# Patient Record
Sex: Male | Born: 1960 | Race: Black or African American | Hispanic: No | Smoking: Former smoker
Health system: Southern US, Community
[De-identification: ages and names within clinical notes are randomized; demographics above are authoritative.]

## PROBLEM LIST (undated history)

## (undated) DIAGNOSIS — E785 Hyperlipidemia, unspecified: Secondary | ICD-10-CM

## (undated) DIAGNOSIS — E119 Type 2 diabetes mellitus without complications: Secondary | ICD-10-CM

## (undated) DIAGNOSIS — D649 Anemia, unspecified: Secondary | ICD-10-CM

## (undated) DIAGNOSIS — G629 Polyneuropathy, unspecified: Secondary | ICD-10-CM

## (undated) DIAGNOSIS — I10 Essential (primary) hypertension: Secondary | ICD-10-CM

## (undated) DIAGNOSIS — I517 Cardiomegaly: Secondary | ICD-10-CM

## (undated) DIAGNOSIS — I2510 Atherosclerotic heart disease of native coronary artery without angina pectoris: Secondary | ICD-10-CM

## (undated) DIAGNOSIS — K219 Gastro-esophageal reflux disease without esophagitis: Secondary | ICD-10-CM

## (undated) HISTORY — DX: Anemia, unspecified: D64.9

## (undated) HISTORY — DX: Atherosclerotic heart disease of native coronary artery without angina pectoris: I25.10

## (undated) HISTORY — DX: Essential (primary) hypertension: I10

## (undated) HISTORY — DX: Gastro-esophageal reflux disease without esophagitis: K21.9

## (undated) HISTORY — DX: Hyperlipidemia, unspecified: E78.5

---

## 2006-05-19 ENCOUNTER — Emergency Department: Payer: Self-pay

## 2006-06-30 ENCOUNTER — Emergency Department: Payer: Self-pay | Admitting: Emergency Medicine

## 2011-01-28 ENCOUNTER — Ambulatory Visit: Payer: Self-pay | Admitting: General Practice

## 2012-05-09 ENCOUNTER — Ambulatory Visit: Payer: Self-pay | Admitting: General Practice

## 2012-06-07 ENCOUNTER — Ambulatory Visit: Payer: Self-pay | Admitting: General Practice

## 2013-06-18 LAB — HM COLONOSCOPY

## 2014-05-15 ENCOUNTER — Ambulatory Visit: Payer: Self-pay | Admitting: General Practice

## 2014-05-19 ENCOUNTER — Ambulatory Visit: Payer: Self-pay | Admitting: General Practice

## 2015-05-08 ENCOUNTER — Emergency Department
Admission: EM | Admit: 2015-05-08 | Discharge: 2015-05-08 | Disposition: A | Discharge status: Home / Self Care | Payer: No Typology Code available for payment source | Attending: Emergency Medicine | Admitting: Emergency Medicine

## 2015-05-08 ENCOUNTER — Encounter: Payer: Self-pay | Admitting: Emergency Medicine

## 2015-05-08 DIAGNOSIS — Y9389 Activity, other specified: Secondary | ICD-10-CM | POA: Insufficient documentation

## 2015-05-08 DIAGNOSIS — Y998 Other external cause status: Secondary | ICD-10-CM | POA: Insufficient documentation

## 2015-05-08 DIAGNOSIS — Z87891 Personal history of nicotine dependence: Secondary | ICD-10-CM | POA: Insufficient documentation

## 2015-05-08 DIAGNOSIS — Y92410 Unspecified street and highway as the place of occurrence of the external cause: Secondary | ICD-10-CM | POA: Diagnosis not present

## 2015-05-08 DIAGNOSIS — T148 Other injury of unspecified body region: Secondary | ICD-10-CM | POA: Diagnosis present

## 2015-05-08 DIAGNOSIS — R52 Pain, unspecified: Secondary | ICD-10-CM

## 2015-05-08 HISTORY — DX: Type 2 diabetes mellitus without complications: E11.9

## 2015-05-08 MED ORDER — NAPROXEN 500 MG PO TABS: 500.0000 mg | ORAL_TABLET | Freq: Two times a day (BID) | ORAL | Status: DC
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
## 2015-05-08 NOTE — Discharge Instructions (Signed)
FOLLOW UP WITH [facility] FOR RE-CHECK OF BLOOD PRESSURE  NAPROXEN FOR MUSCLE SORENESS.  EAT BEFORE TAKING THIS MEDICATION AND STOP IF ANY UPSET STOMACH
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
## 2015-05-08 NOTE — ED Notes (Signed)
D/c instructions reviewed w/ pt - pt denies any further questions or concerns at present.   

## 2015-05-20 ENCOUNTER — Other Ambulatory Visit: Payer: Self-pay | Admitting: Physician Assistant

## 2015-05-20 ENCOUNTER — Ambulatory Visit
Admission: RE | Admit: 2015-05-20 | Discharge: 2015-05-20 | Disposition: A | Discharge status: Home / Self Care | Payer: BLUE CROSS/BLUE SHIELD | Source: Ambulatory Visit | Attending: Physician Assistant | Admitting: Physician Assistant

## 2015-05-20 ENCOUNTER — Telehealth: Payer: Self-pay | Admitting: Gastroenterology

## 2015-05-20 DIAGNOSIS — K5900 Constipation, unspecified: Secondary | ICD-10-CM | POA: Diagnosis present

## 2015-05-20 DIAGNOSIS — R1084 Generalized abdominal pain: Secondary | ICD-10-CM | POA: Insufficient documentation

## 2015-05-20 DIAGNOSIS — R109 Unspecified abdominal pain: Secondary | ICD-10-CM

## 2015-05-20 NOTE — Telephone Encounter (Signed)
LVM for pt to call and sch appt with Dr. Servando SnareWohl for upper ab pain

## 2015-07-23 DIAGNOSIS — I214 Non-ST elevation (NSTEMI) myocardial infarction: Secondary | ICD-10-CM | POA: Insufficient documentation

## 2015-07-23 DIAGNOSIS — E118 Type 2 diabetes mellitus with unspecified complications: Secondary | ICD-10-CM | POA: Insufficient documentation

## 2015-07-23 HISTORY — PX: STENT PLACEMENT VASCULAR (ARMC HX): HXRAD1737

## 2016-03-09 DIAGNOSIS — I10 Essential (primary) hypertension: Secondary | ICD-10-CM | POA: Insufficient documentation

## 2016-04-10 IMAGING — CR DG ABDOMEN 1V
1 series · 2 of 2 positions shown · non-contrast
Comparison: None.

CLINICAL DATA: Sharp epigastric pain.

EXAM:
ABDOMEN - 1 VIEW

[Series 1: dg abd 1 view · 0.14mm/px · 2 of 2 slices shown]
[im 1/2]
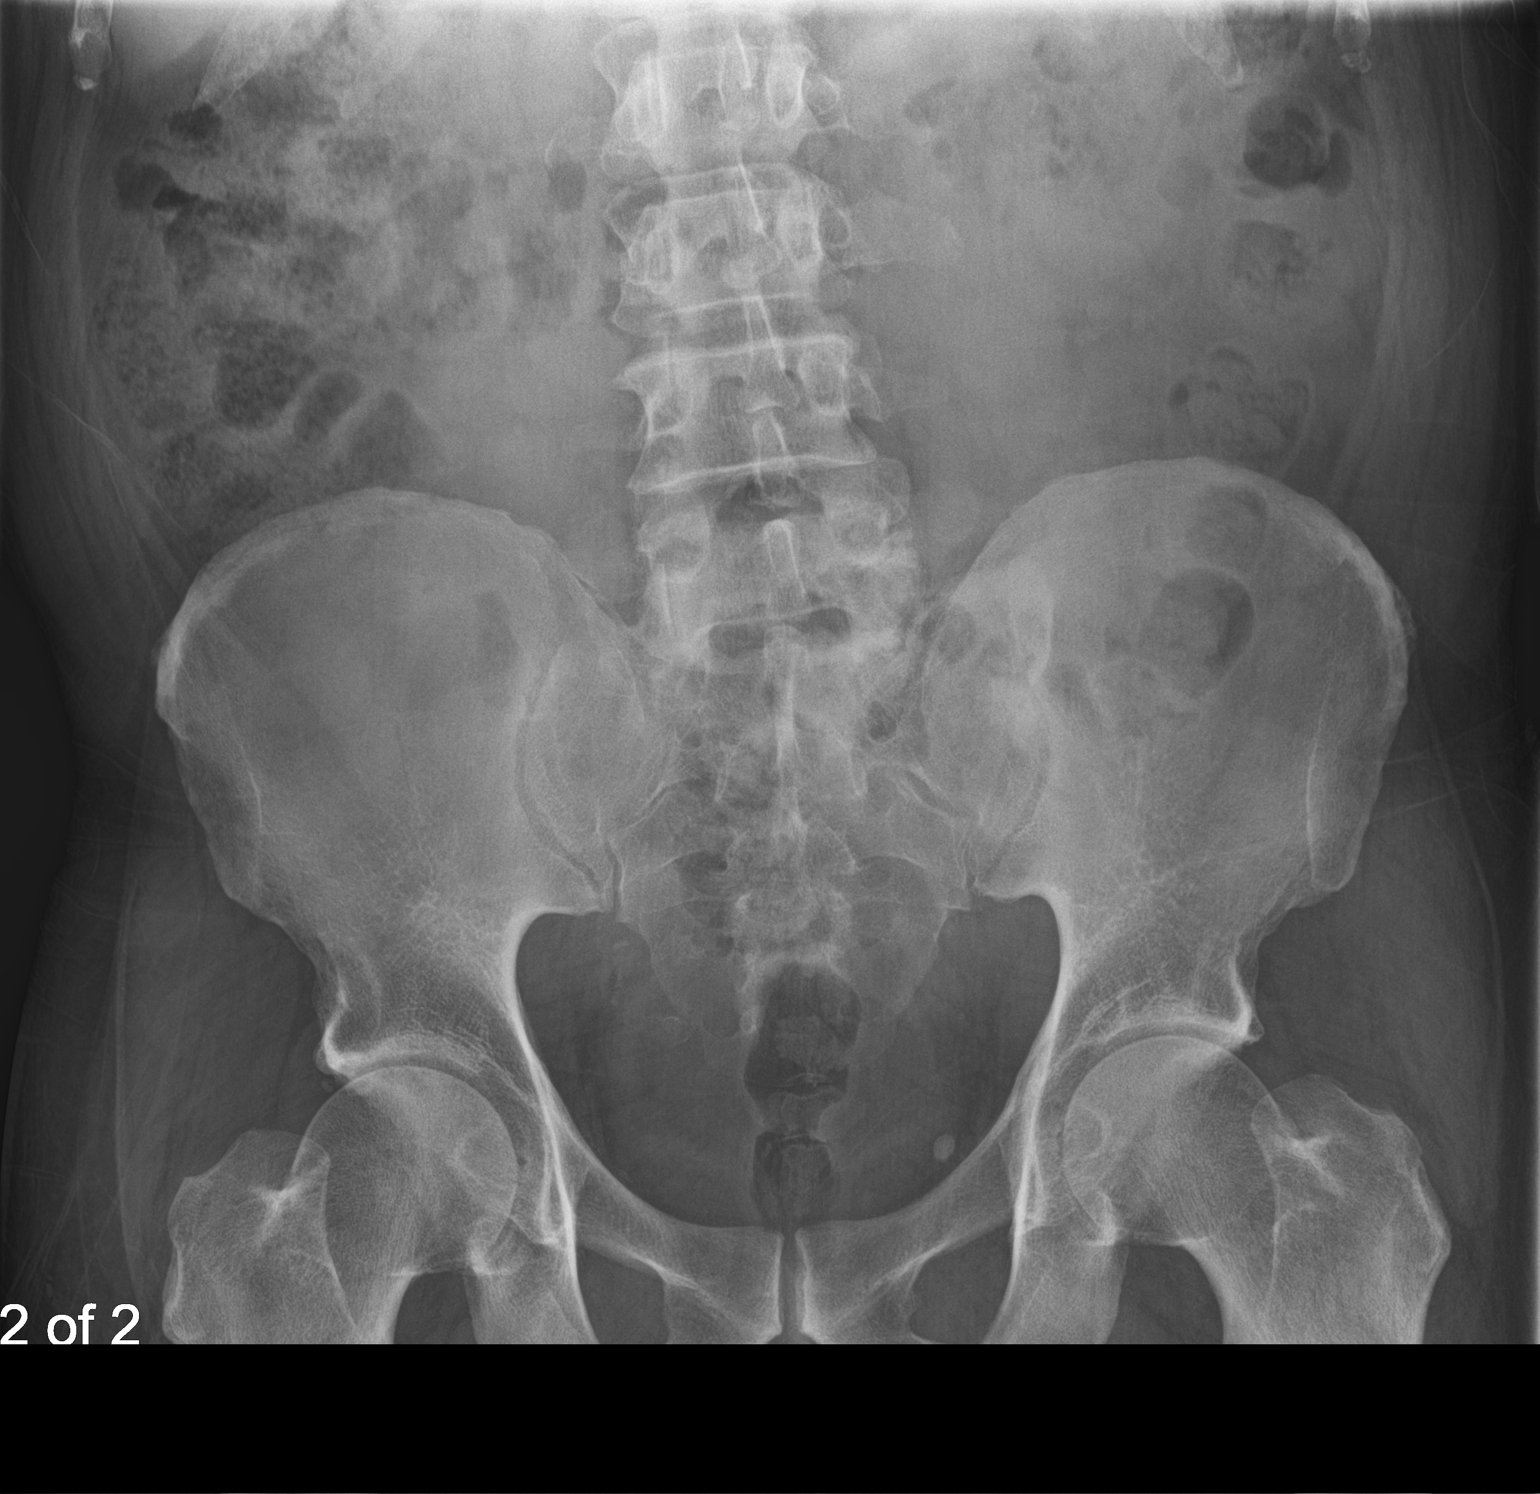
[im 2/2]
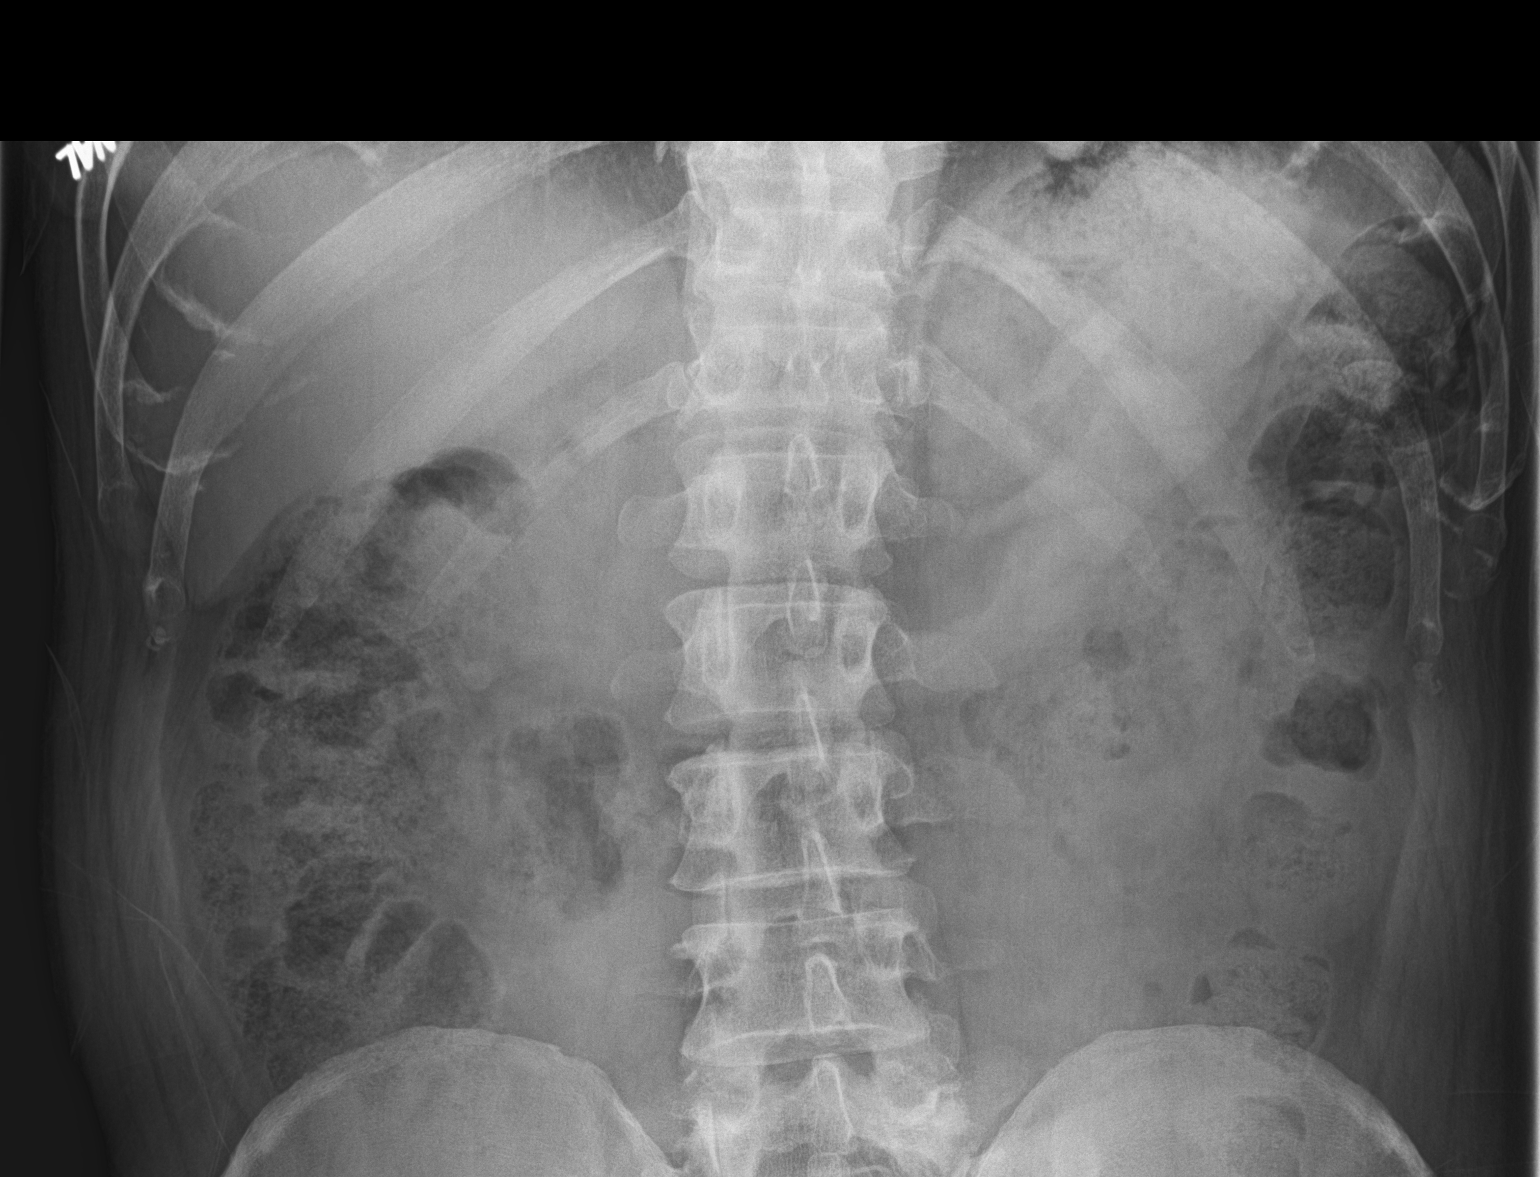

[2 of 2 positions shown; findings below may reference images not displayed]

FINDINGS: The bowel gas pattern is normal. No fecal impaction or excessive
stool in the colon. No dilated large or small bowel. The stomach is
not distended. No worrisome abdominal calcifications. Osseous
structures are normal.
IMPRESSION: Benign appearing abdomen.

## 2016-06-27 ENCOUNTER — Ambulatory Visit
Admission: RE | Admit: 2016-06-27 | Discharge: 2016-06-27 | Disposition: A | Discharge status: Home / Self Care | Payer: BLUE CROSS/BLUE SHIELD | Source: Ambulatory Visit | Attending: Family Medicine | Admitting: Family Medicine

## 2016-06-27 ENCOUNTER — Other Ambulatory Visit: Payer: Self-pay | Admitting: Family Medicine

## 2016-06-27 DIAGNOSIS — G629 Polyneuropathy, unspecified: Secondary | ICD-10-CM

## 2016-07-05 DIAGNOSIS — M79601 Pain in right arm: Secondary | ICD-10-CM | POA: Insufficient documentation

## 2016-08-15 ENCOUNTER — Ambulatory Visit: Payer: BLUE CROSS/BLUE SHIELD | Attending: Neurological Surgery | Admitting: Occupational Therapy

## 2016-08-15 DIAGNOSIS — M6281 Muscle weakness (generalized): Secondary | ICD-10-CM | POA: Diagnosis present

## 2016-08-15 DIAGNOSIS — R208 Other disturbances of skin sensation: Secondary | ICD-10-CM | POA: Diagnosis present

## 2016-08-15 NOTE — Patient Instructions (Signed)
Pt ed on modifications of task  - no flexion more than 90 -no propping up on elbow -no repetative flexion and extention of elbow  Wear elbow soft sleeve - with padding inside sleeping -and outside when propping up elbow  2 provided for work and night time   Ulnar N glide 5 reps  After heat  2 x day  and can do ice massage few times day over cubital tunnel

## 2016-08-15 NOTE — Therapy (Signed)
[facility] PHYSICAL AND SPORTS MEDICINE [address] [place], [place], [address] Phone: [phone]   Fax:  [phone]  Occupational Therapy Treatment  Patient Details  Name: Frank Miles MRN: [id number] Date of Birth: 1961/03/02 Referring Provider: Ditty  Encounter Date: 08/15/2016      OT End of Session - 08/15/16 1129    Visit Number 1   Number of Visits 8   Date for OT Re-Evaluation 09/12/16   OT Start Time 1030   OT Stop Time 1111   OT Time Calculation (min) 41 min   Activity Tolerance Patient tolerated treatment well   Behavior [facility] Therapy [facility] for tasks assessed/performed      Past Medical History:  Diagnosis Date  . Diabetes mellitus without complication     No past surgical history on file.  There were no vitals filed for this visit.      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   Referring Provider Ditty   Onset Date 02/06/16     Home  Environment   Lives With Alone     Prior Function   Vocation Full time employment   Leisure [place] full time for [place]  - mowing a lot since Spring , work out and walk , do own house work , R hand dominant -     Designer, fashion/clothingensation   Semmes Weinstein Monofilament Scale Diminshed Light Touch  not able to detect normal sensation on  4thand 5th -      AROM   Right Elbow Extension -10   Left Elbow Extension 0     Strength   Right Hand Grip (lbs) 77   Right Hand Lateral Pinch 26 lbs   Right Hand 3 Point Pinch 16 lbs   Left Hand Grip (lbs) 80   Left Hand Lateral Pinch 20 lbs   Left Hand 3 Point Pinch 16 lbs        Pt ed on modifications of task  - no flexion more than 90 -no propping up on elbow -no repetative flexion and extention of elbow  Fabricated for pt elbow pad - elbow soft sleeve - with padding inside sleeping -and outside when propping up elbow  2 provided for work and night time   Ulnar N glide 5 reps  After heat  2 x day  and can do ice massage few times day over cubital tunnel                      OT Education - 08/15/16 1129    Education provided Yes  Education Details HEP and modifications    Person(s) Educated Patient   Methods Explanation;Demonstration;Tactile cues;Verbal cues   Comprehension Returned demonstration          OT Short Term Goals - 08/15/16 1632      OT SHORT TERM GOAL #1   Title Pt to be ind in modifying activities and wearing elbow pad  to decrease symptoms of numbness    Baseline no knowledge    Time 2   Period Weeks   Status New     OT SHORT TERM GOAL #2   Title Pt  show decrease syptoms of numbness on PREE by at least 10 points    Baseline PREE for numbness 15/50 at eval   Time 3   Period Weeks   Status New           OT Long Term Goals - 08/15/16 1637      OT LONG TERM GOAL #1   Title R grip strength improve with at least 10 lbs to have more ease with lifting or carrying more than 10 lbs    Baseline Grip 77 R, 80 L    Time 5   Period Weeks   Status New     OT LONG TERM GOAL #2   Title Pt improve with strength and numbness to report no isssue with mowing,  throwing ball , pull or lift more than 10 lbs , working out    Baseline pt report 20% issues with these activities    Time 4   Period Weeks   Status New                Plan - 08/15/16 1627    Clinical Impression Statement Pt present at eval with cubital tunnel - symptoms present for about 6 wks - nerve conduction test  was positivie for ulnar N compression at elbow - decrease sensation on 4thand 5th digits, decrease ADD of 5th but negative for Froments sign -tenderness and positive Tinel at cubital tunnel -  grip decrease compare to L - decrease elbow extnention -  limiting his use of  R dominant hand in work , home and recreational activities -    Rehab Potential Good   OT Frequency 2x / week   OT Duration 4 weeks   OT Treatment/Interventions Self-care/ADL training;Moist Heat;Patient/family education;Splinting;Therapeutic exercises;Iontophoresis;Cryotherapy;Manual Therapy   Plan assess pt's progress with making  modifications of tasks    OT Home Exercise Plan see pt instruction    Consulted and Agree with Plan of Care Patient      Patient will benefit from skilled therapeutic intervention in order to improve the following deficits and impairments:  Decreased range of motion, Impaired flexibility, Impaired sensation, Decreased knowledge of precautions, Impaired UE functional use, Decreased strength  Visit Diagnosis: Other disturbances of skin sensation - Plan: Ot plan of care cert/re-cert  Muscle weakness - Plan: Ot plan of care cert/re-cert    Problem List There are no active problems to display for this patient.   Oletta Cohn OTR/L,CLT 08/15/2016, 4:48 PM  [place] [facility] PHYSICAL AND SPORTS MEDICINE [address] [place], [address] Phone: [phone]   Fax:  [phone]  Name: Frank Miles MRN: [id number] Date of Birth: February 20, 1961

## 2016-08-24 ENCOUNTER — Ambulatory Visit: Payer: BLUE CROSS/BLUE SHIELD | Admitting: Occupational Therapy

## 2016-08-24 DIAGNOSIS — R208 Other disturbances of skin sensation: Principal | ICD-10-CM

## 2016-08-24 DIAGNOSIS — M6281 Muscle weakness (generalized): Secondary | ICD-10-CM

## 2016-08-24 NOTE — Therapy (Signed)
[facility] [facility] PHYSICAL AND SPORTS MEDICINE [address] [place], [place], [address] Phone: [phone]   Fax:  [phone]  Occupational Therapy Treatment  Patient Details  Name: Frank Miles MRN: [id number] Date of Birth: 1961/04/12 Referring Provider: Ditty  Encounter Date: 08/24/2016      OT End of Session - 08/24/16 1723    Visit Number 2   Number of Visits 8   Date for OT Re-Evaluation 09/12/16   OT Start Time 1620   OT Stop Time 1647   OT Time Calculation (min) 27 min   Activity Tolerance Patient tolerated treatment well   Behavior During Therapy [facility] for tasks assessed/performed      Past Medical History:  Diagnosis Date  . Diabetes mellitus without complication     No past surgical history on file.  There were no vitals filed for this visit.      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 No/denies         Grip assess and prehension - same  Froments neg  Negative  For tenderness at Cubital tunnel and  Tinel Still little weakness on 5th ADD  Reviewed again with pt on  modifications of task  - no flexion more than 90 -no propping up on elbow -no repetative flexion and extention of elbow  Cont to wear every night fabricated  elbow pad - elbow soft sleeve - with padding inside sleeping -and outside when propping up elbow  2 provided for work and night time  Provided some foam for truck armrest   Ulnar N glide 5 reps  Reviewed -  pt do not have full extention -  ed pt on using heat and can do AROM for elbow extention in supination position against wall - but need to be pain free  After heat  2 x day  and can do ice massage few times day over cubital tunnel                        OT Education - 08/24/16 1723    Education provided Yes   Education Details HEP update   Person(s) Educated Patient   Methods Explanation;Demonstration;Tactile cues;Verbal cues   Comprehension Verbal cues required;Returned demonstration;Verbalized understanding          OT Short Term Goals - 08/15/16 1632      OT SHORT TERM GOAL #1   Title Pt to be ind in modifying activities and wearing elbow pad  to decrease symptoms of numbness    Baseline no knowledge    Time 2   Period Weeks   Status New     OT SHORT TERM GOAL #2   Title Pt  show decrease syptoms of numbness on PREE by at least 10 points    Baseline PREE for numbness 15/50 at eval   Time 3  Period Weeks   Status New           OT Long Term Goals - 08/15/16 1637      OT LONG TERM GOAL #1   Title R grip strength improve with at least 10 lbs to have more ease with lifting or carrying more than 10 lbs    Baseline Grip 77 R, 80 L    Time 5   Period Weeks   Status New     OT LONG TERM GOAL #2   Title Pt improve with strength and numbness to report no isssue with mowing,  throwing ball , pull or lift more than 10 lbs , working out    Baseline pt report 20% issues with these activities    Time 4   Period Weeks   Status New               Plan - 08/24/16 1724    Clinical Impression Statement Pt show improvement in pain , numbness - but not as often - when modifying activities- pt to cont with HEP - he is not tender and neg Tinel over Cubital tunnel - pt to cont with HEP for 3 wks and will check again - work on ulnar N glides and end range extention    Rehab Potential Good   OT Frequency Biweekly   OT Duration 4 weeks   OT Treatment/Interventions Self-care/ADL  training;Moist Heat;Patient/family education;Splinting;Therapeutic exercises;Iontophoresis;Cryotherapy;Manual Therapy   Plan assess grip and prehesnion - and if symptoms free with home program   OT Home Exercise Plan see pt instruction    Consulted and Agree with Plan of Care Patient      Patient will benefit from skilled therapeutic intervention in order to improve the following deficits and impairments:  Decreased range of motion, Impaired flexibility, Impaired sensation, Decreased knowledge of precautions, Impaired UE functional use, Decreased strength  Visit Diagnosis: Other disturbances of skin sensation  Muscle weakness    Problem List There are no active problems to display for this patient.   Oletta Cohn OTR/L,CLT 08/24/2016, 5:28 PM  [place] [facility] PHYSICAL [facility] SPORTS MEDICINE [address] [place], [address] Phone: [phone]   Fax:  [phone]  Name: Frank Miles MRN: [id number] Date of Birth: 05-18-1961

## 2016-08-24 NOTE — Patient Instructions (Addendum)
Cont with  modifications of task  - no flexion more than 90 -no propping up on elbow -no repetative flexion and extention of elbow  Cont to wear every night fabricated  elbow pad - elbow soft sleeve - with padding inside sleeping -and outside when propping up elbow  2 provided for work and night time  Provided some foam for truck armrest   Ulnar N glide 5 reps  Reviewed -  pt do not have full extention - ed pt on using heat and can do AROM for elbow extention in supination position against wall - but need to be pain free  After heat  2 x day  and can do ice massage few times day over cubital tunnel

## 2016-08-25 ENCOUNTER — Encounter: Payer: BLUE CROSS/BLUE SHIELD | Admitting: Occupational Therapy

## 2016-09-21 ENCOUNTER — Ambulatory Visit: Payer: BLUE CROSS/BLUE SHIELD | Admitting: Occupational Therapy

## 2016-09-22 ENCOUNTER — Encounter: Payer: BLUE CROSS/BLUE SHIELD | Admitting: Occupational Therapy

## 2017-01-09 ENCOUNTER — Other Ambulatory Visit: Payer: Self-pay | Admitting: Family Medicine

## 2017-01-09 ENCOUNTER — Ambulatory Visit
Admission: RE | Admit: 2017-01-09 | Discharge: 2017-01-09 | Disposition: A | Discharge status: Home / Self Care | Payer: Worker's Compensation | Source: Ambulatory Visit | Attending: Family Medicine | Admitting: Family Medicine

## 2017-01-09 DIAGNOSIS — M5432 Sciatica, left side: Secondary | ICD-10-CM | POA: Diagnosis not present

## 2017-01-09 DIAGNOSIS — M47816 Spondylosis without myelopathy or radiculopathy, lumbar region: Secondary | ICD-10-CM | POA: Insufficient documentation

## 2017-01-09 DIAGNOSIS — R52 Pain, unspecified: Secondary | ICD-10-CM

## 2017-01-09 DIAGNOSIS — M549 Dorsalgia, unspecified: Secondary | ICD-10-CM | POA: Diagnosis not present

## 2017-05-23 LAB — IRON,TIBC AND FERRITIN PANEL: Ferritin: 7

## 2017-05-23 LAB — HEMOGLOBIN A1C: Hemoglobin A1C: 6.1

## 2017-09-04 LAB — LIPID PANEL
Cholesterol: 94 (ref 0–200)
HDL: 43 (ref 35–70)
LDL Cholesterol: 40
Triglycerides: 55 (ref 40–160)

## 2017-09-04 LAB — IRON,TIBC AND FERRITIN PANEL
Ferritin: 9
Iron: 34

## 2017-09-04 LAB — HEMOGLOBIN A1C: Hemoglobin A1C: 6.3

## 2017-12-01 IMAGING — CR DG LUMBAR SPINE COMPLETE 4+V
1 series · 5 of 5 positions shown · non-contrast
Comparison: Abdominal radiographs 05/20/2015. Lumbar MRI
05/15/2014.

CLINICAL DATA: Low back pain extending into the left leg with
numbness following injury (struck by a tractor) 2 days ago. Initial
encounter.

EXAM:
LUMBAR SPINE - COMPLETE 4+ VIEW

[Series 1: dg lumbar spine complete 4 +v · 0.14mm/px · 5 of 5 slices shown]
[im 1/5]
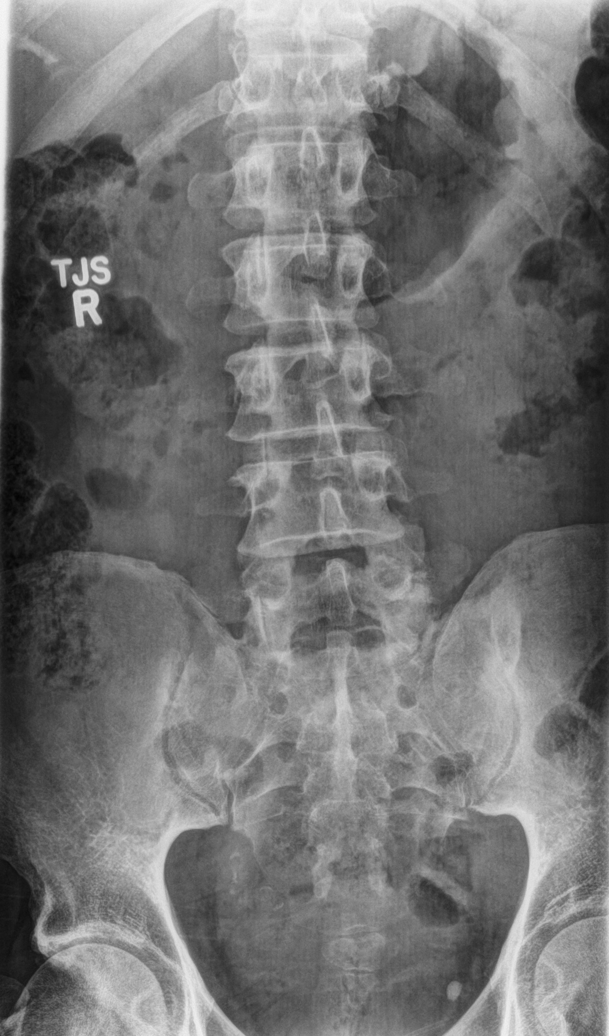
[im 2/5]
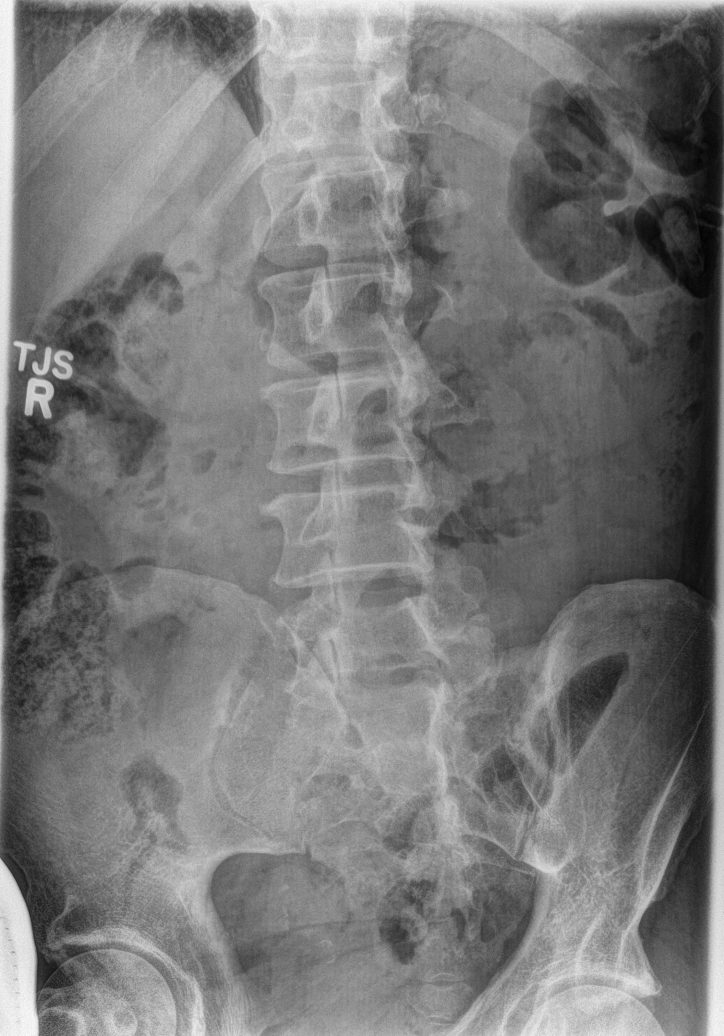
[im 3/5]
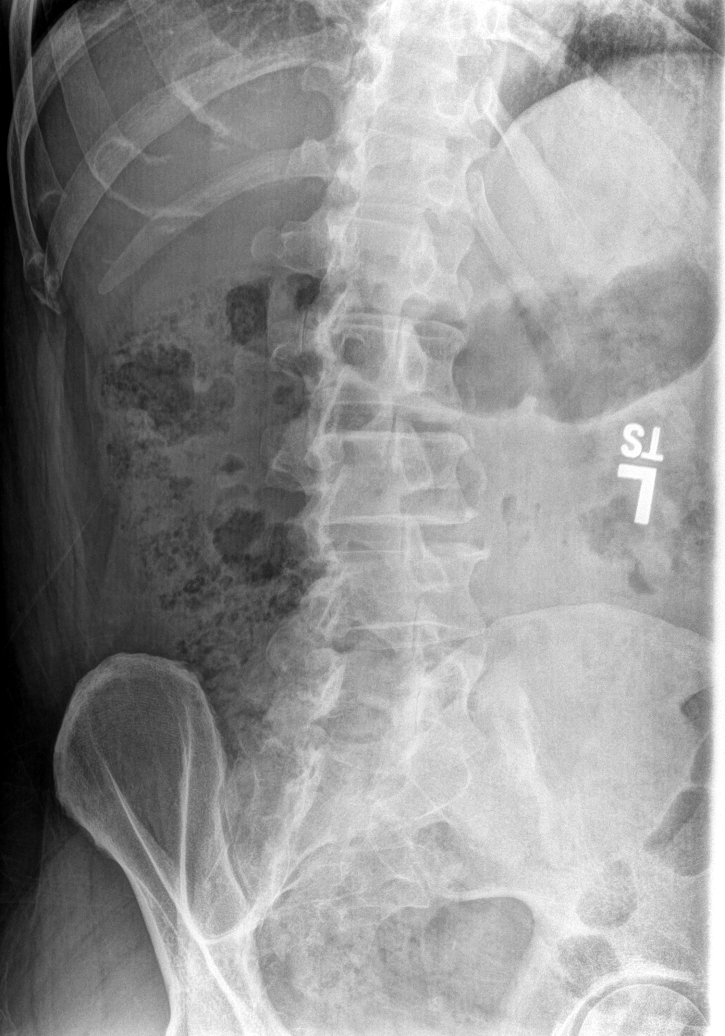
[im 4/5]
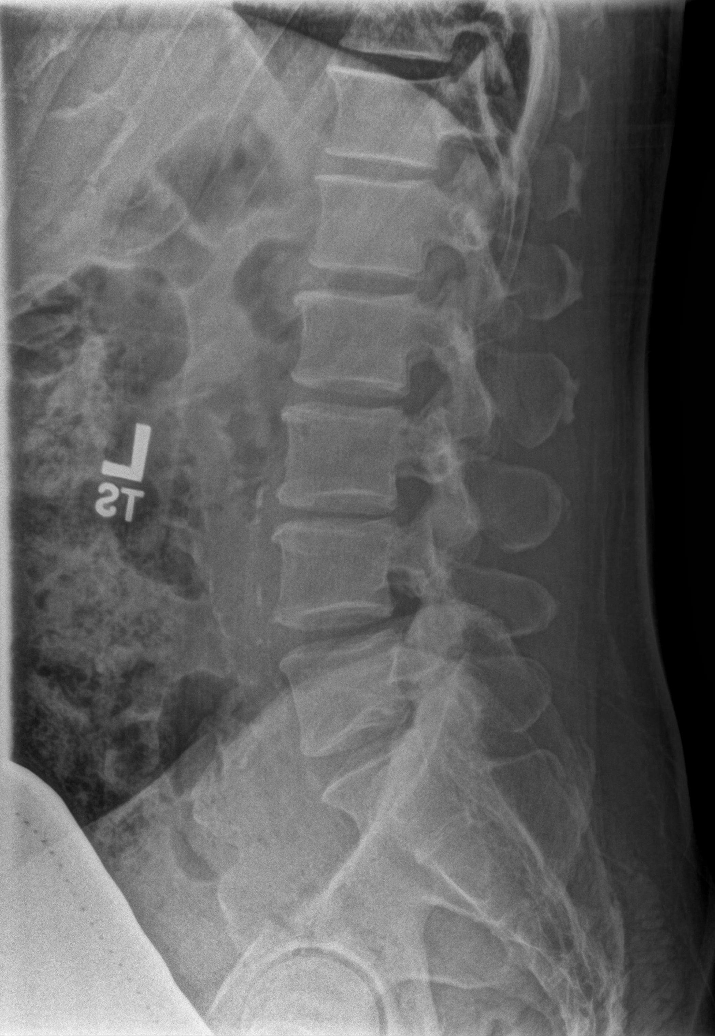
[im 5/5]
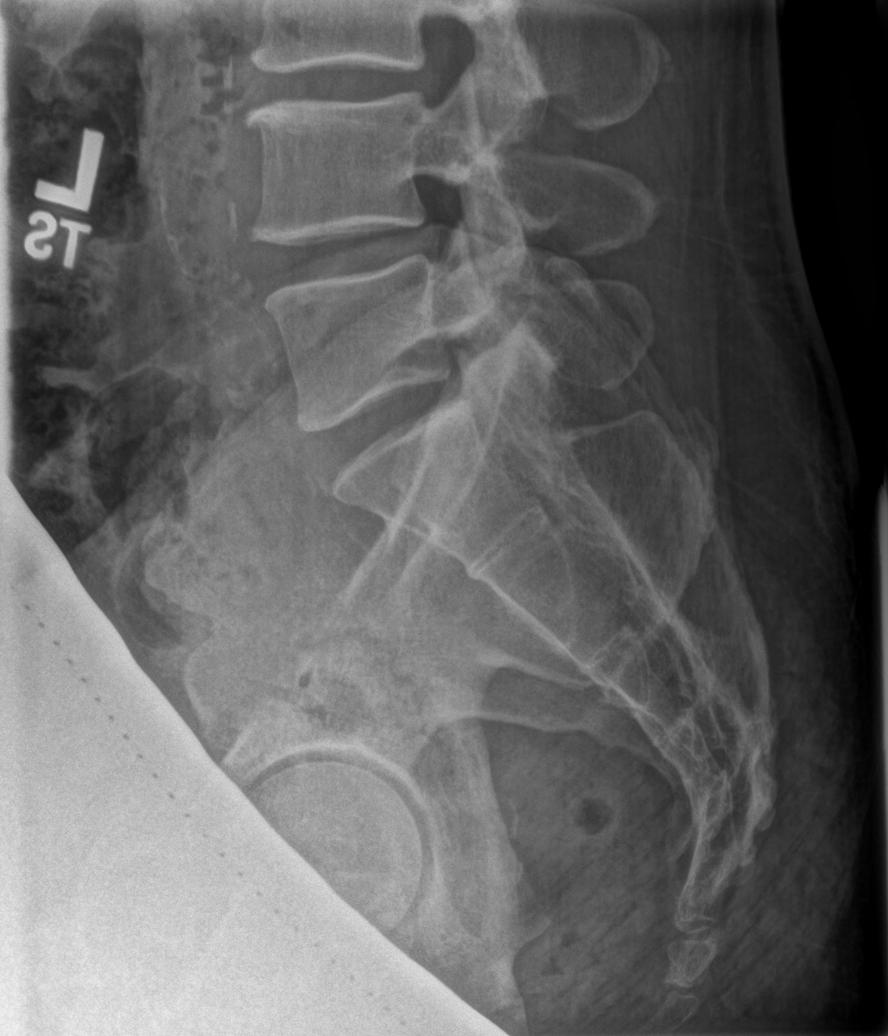

[5 of 5 positions shown; findings below may reference images not displayed]

FINDINGS: There are 5 lumbar type vertebral bodies. The alignment is near
anatomic with mild straightening and a minimal convex right
scoliosis. Mild intervertebral spurring is present throughout the
lumbar spine with relatively preserved disc height. No evidence of
acute fracture or pars defect. Mild aortoiliac atherosclerosis
noted.
IMPRESSION: No acute osseous findings.  Mild lumbar spondylosis.

## 2017-12-14 LAB — IRON,TIBC AND FERRITIN PANEL: Ferritin: 29

## 2018-03-08 ENCOUNTER — Ambulatory Visit: Payer: BLUE CROSS/BLUE SHIELD | Admitting: Gastroenterology

## 2018-03-08 ENCOUNTER — Encounter (INDEPENDENT_AMBULATORY_CARE_PROVIDER_SITE_OTHER): Payer: Self-pay

## 2018-03-08 ENCOUNTER — Encounter: Payer: Self-pay | Admitting: Gastroenterology

## 2018-03-08 VITALS — BP 158/75 | HR 70 | Ht 68.0 in | Wt 203.4 lb

## 2018-03-08 DIAGNOSIS — D696 Thrombocytopenia, unspecified: Secondary | ICD-10-CM | POA: Diagnosis not present

## 2018-03-08 DIAGNOSIS — D509 Iron deficiency anemia, unspecified: Secondary | ICD-10-CM | POA: Diagnosis not present

## 2018-03-08 DIAGNOSIS — R195 Other fecal abnormalities: Secondary | ICD-10-CM

## 2018-03-08 NOTE — Addendum Note (Signed)
Addended by: Ardyth Man on: 03/08/2018 04:15 PM   Modules accepted: Orders, SmartSet

## 2018-03-08 NOTE — Progress Notes (Signed)
Wyline Mood MD, MRCP(U.K) [address]  [address]  [place], [place] [address]  Main: [phone]  Fax: [phone]   Gastroenterology Consultation  Referring Provider:     Gilles Chiquito, MD Primary Care Physician:  System, Pcp Not In Primary Gastroenterologist:  Dr. Wyline Mood  Reason for Consultation:     Stool occult positive and anemia         HPI:   Frank Miles is a 57 y.o. y/o male .He has been referred for a positive stool occult test x3 and anemia. Labs in 08/2017 showed an iron deficiency anemia, was commenced on iron at that time. Unclear if any evaluation done for iron deficiency anemia last 6 months.   Denies any overt blood loss in stool or urine. Last colonoscopy 2015 normal. No family history of colon cancer. Denies any NSAID use . He used to be on Plavix . On asprin . He has cardiac stents. Last drink of alcohol 13 years back. Consumes a lot of alcohol prior to that.   Past Medical History:  Diagnosis Date  . Diabetes mellitus without complication     No past surgical history on file.  Prior to Admission medications   Medication Sig Start Date End Date Taking? Authorizing Provider  metFORMIN (GLUCOPHAGE) 500 MG tablet metformin 500 mg tablet 12/05/16  Yes [provider]  atorvastatin (LIPITOR) 80 MG tablet  02/23/18   [provider]  clopidogrel (PLAVIX) 75 MG tablet  02/21/18   [provider]  cyclobenzaprine (FLEXERIL) 10 MG tablet cyclobenzaprine 10 mg tablet    [provider]  Difluprednate (DUREZOL) 0.05 % EMUL Durezol 0.05 % eye drops    [provider]  GLIPIZIDE XL 5 MG 24 hr tablet  02/21/18   [provider]  lisinopril (PRINIVIL,ZESTRIL) 20 MG tablet  02/25/18   [provider]  meloxicam (MOBIC) 15 MG tablet meloxicam 15 mg tablet    [provider]  naproxen (NAPROSYN) 500 MG tablet Take 1 tablet (500 mg total) by mouth 2 (two) times daily with a meal. 05/08/15    Tommi Rumps, PA-C  traMADol (ULTRAM) 50 MG tablet tramadol 50 mg tablet    [provider]    No family history on file.   Social History   Tobacco Use  . Smoking status: Former Smoker  Substance Use Topics  . Alcohol use: No  . Drug use: Not on file    Allergies as of 03/08/2018 - Review Complete 08/15/2016  Allergen Reaction Noted  . Bee venom Swelling 05/08/2015    Review of Systems:    All systems reviewed and negative except where noted in HPI.   Physical Exam:  There were no vitals taken for this visit. No LMP for male patient. Psych:  Alert and cooperative. Normal mood and affect. General:   Alert,  Well-developed, well-nourished, pleasant and cooperative in NAD Head:  Normocephalic and atraumatic. Eyes:  Sclera clear, no icterus.   Conjunctiva pink. Ears:  Normal auditory acuity. Nose:  No deformity, discharge, or lesions. Mouth:  No deformity or lesions,oropharynx pink & moist. Neck:  Supple; no masses or thyromegaly. Lungs:  Respirations even and unlabored.  Clear throughout to auscultation.   No wheezes, crackles, or rhonchi. No acute distress. Heart:  Regular rate and rhythm; no murmurs, clicks, rubs, or gallops. Abdomen:  Normal bowel sounds.  No bruits.  Soft, non-tender and non-distended without masses, hepatosplenomegaly or hernias noted.  No guarding or rebound  tenderness.    Neurologic:  Alert and oriented x3;  grossly normal neurologically. Skin:  Intact without significant lesions or rashes. No jaundice. Lymph Nodes:  No significant cervical adenopathy. Psych:  Alert and cooperative. Normal mood and affect.  Imaging Studies: No results found.  Assessment and Plan:   Frank Miles is a 57 y.o. y/o male has  been referred for anemia and stool occult test positive. He has had iron deficiency since 08/2017 . No overt blood loss. H/o alcohol in excess till 13 years back . Now has thrombocytopenia. Need to r/o porta;  hypertension.  Plan  1.Check celiac serology , Hepatic function panel,INR. 2. EGD+Colonoscopy+/- capsule study of the small bowel  3. RUQ USG 4. B12,folate   I have discussed alternative options, risks & benefits,  which include, but are not limited to, bleeding, infection, perforation,respiratory complication & drug reaction.  The patient agrees with this plan & written consent will be obtained.    Follow up in 8 weeks   Dr Wyline Mood MD,MRCP(U.K)

## 2018-03-13 ENCOUNTER — Other Ambulatory Visit: Payer: Self-pay

## 2018-03-13 DIAGNOSIS — Z1211 Encounter for screening for malignant neoplasm of colon: Secondary | ICD-10-CM

## 2018-03-13 MED ORDER — PEG 3350-KCL-NA BICARB-NACL 420 G PO SOLR: 4000.0000 mL | Freq: Once | ORAL | 0 refills | Status: AC

## 2018-03-15 ENCOUNTER — Encounter
Admission: RE | Disposition: A | Discharge status: Home / Self Care | Payer: Self-pay | Source: Ambulatory Visit | Attending: Gastroenterology

## 2018-03-15 ENCOUNTER — Ambulatory Visit: Payer: BLUE CROSS/BLUE SHIELD | Admitting: Anesthesiology

## 2018-03-15 ENCOUNTER — Ambulatory Visit
Admission: RE | Admit: 2018-03-15 | Discharge: 2018-03-15 | Disposition: A | Discharge status: Home / Self Care | Payer: BLUE CROSS/BLUE SHIELD | Source: Ambulatory Visit | Attending: Gastroenterology | Admitting: Gastroenterology

## 2018-03-15 ENCOUNTER — Encounter: Payer: Self-pay | Admitting: Emergency Medicine

## 2018-03-15 DIAGNOSIS — Z79899 Other long term (current) drug therapy: Secondary | ICD-10-CM | POA: Insufficient documentation

## 2018-03-15 DIAGNOSIS — B9681 Helicobacter pylori [H. pylori] as the cause of diseases classified elsewhere: Secondary | ICD-10-CM | POA: Insufficient documentation

## 2018-03-15 DIAGNOSIS — D509 Iron deficiency anemia, unspecified: Secondary | ICD-10-CM | POA: Diagnosis not present

## 2018-03-15 DIAGNOSIS — Z87891 Personal history of nicotine dependence: Secondary | ICD-10-CM | POA: Insufficient documentation

## 2018-03-15 DIAGNOSIS — E119 Type 2 diabetes mellitus without complications: Secondary | ICD-10-CM | POA: Insufficient documentation

## 2018-03-15 DIAGNOSIS — Z7982 Long term (current) use of aspirin: Secondary | ICD-10-CM | POA: Insufficient documentation

## 2018-03-15 DIAGNOSIS — K2950 Unspecified chronic gastritis without bleeding: Secondary | ICD-10-CM | POA: Diagnosis not present

## 2018-03-15 DIAGNOSIS — I10 Essential (primary) hypertension: Secondary | ICD-10-CM | POA: Diagnosis not present

## 2018-03-15 DIAGNOSIS — Z955 Presence of coronary angioplasty implant and graft: Secondary | ICD-10-CM | POA: Diagnosis not present

## 2018-03-15 DIAGNOSIS — K640 First degree hemorrhoids: Secondary | ICD-10-CM | POA: Insufficient documentation

## 2018-03-15 DIAGNOSIS — I252 Old myocardial infarction: Secondary | ICD-10-CM | POA: Insufficient documentation

## 2018-03-15 DIAGNOSIS — Z791 Long term (current) use of non-steroidal anti-inflammatories (NSAID): Secondary | ICD-10-CM | POA: Diagnosis not present

## 2018-03-15 DIAGNOSIS — K2970 Gastritis, unspecified, without bleeding: Secondary | ICD-10-CM | POA: Diagnosis not present

## 2018-03-15 DIAGNOSIS — I2510 Atherosclerotic heart disease of native coronary artery without angina pectoris: Secondary | ICD-10-CM | POA: Insufficient documentation

## 2018-03-15 DIAGNOSIS — Z7984 Long term (current) use of oral hypoglycemic drugs: Secondary | ICD-10-CM | POA: Diagnosis not present

## 2018-03-15 HISTORY — PX: ESOPHAGOGASTRODUODENOSCOPY (EGD) WITH PROPOFOL: SHX5813

## 2018-03-15 HISTORY — PX: COLONOSCOPY WITH PROPOFOL: SHX5780

## 2018-03-15 LAB — GLUCOSE, CAPILLARY: Glucose-Capillary: 102 mg/dL — ABNORMAL HIGH (ref 65–99)

## 2018-03-15 SURGERY — ESOPHAGOGASTRODUODENOSCOPY (EGD) WITH PROPOFOL
Anesthesia: General

## 2018-03-15 MED ORDER — PROPOFOL 10 MG/ML IV BOLUS
INTRAVENOUS | Status: DC | PRN
  Administered 2018-03-15: 90 mg via INTRAVENOUS

## 2018-03-15 MED ORDER — PROPOFOL 500 MG/50ML IV EMUL
INTRAVENOUS | Status: AC
  Filled 2018-03-15: qty 50

## 2018-03-15 MED ORDER — PROPOFOL 500 MG/50ML IV EMUL
INTRAVENOUS | Status: DC | PRN
  Administered 2018-03-15: 140 ug/kg/min via INTRAVENOUS

## 2018-03-15 MED ORDER — MIDAZOLAM HCL 2 MG/2ML IJ SOLN
INTRAMUSCULAR | Status: AC
  Filled 2018-03-15: qty 2

## 2018-03-15 MED ORDER — SODIUM CHLORIDE 0.9 % IV SOLN
INTRAVENOUS | Status: DC | PRN
  Administered 2018-03-15: 10:00:00 via INTRAVENOUS

## 2018-03-15 MED ORDER — GLYCOPYRROLATE 0.2 MG/ML IJ SOLN
INTRAMUSCULAR | Status: AC
Start: 2018-03-15 — End: ?
  Filled 2018-03-15: qty 1

## 2018-03-15 MED ORDER — MIDAZOLAM HCL 2 MG/2ML IJ SOLN
INTRAMUSCULAR | Status: DC | PRN
  Administered 2018-03-15: 2 mg via INTRAVENOUS

## 2018-03-15 MED ORDER — LIDOCAINE HCL (CARDIAC) PF 100 MG/5ML IV SOSY
PREFILLED_SYRINGE | INTRAVENOUS | Status: DC | PRN
  Administered 2018-03-15: 40 mg via INTRAVENOUS

## 2018-03-15 NOTE — Anesthesia Post-op Follow-up Note (Signed)
Anesthesia QCDR form completed.        

## 2018-03-15 NOTE — Op Note (Signed)
[facility] Gastroenterology Patient Name: Frank Miles Procedure Date: 03/15/2018 10:09 AM MRN: [id number] Account #: [id number] Date of Birth: 01-23-1961 Admit Type: Outpatient Age: 57 Room: [facility] ENDO ROOM 4 Gender: Male Note Status: Finalized Procedure:            Upper GI endoscopy Indications:          Iron deficiency anemia Providers:            Wyline Mood MD, MD Referring MD:         No Local Md, MD (Referring MD) Medicines:            Monitored Anesthesia Care Complications:        No immediate complications. Procedure:            Pre-Anesthesia Assessment:                       - Prior to the procedure, a History and Physical was                        performed, and patient medications, allergies and                        sensitivities were reviewed. The patient's tolerance of                        previous anesthesia was reviewed.                       - The risks and benefits of the procedure and the                        sedation options and risks were discussed with the                        patient. All questions were answered and informed                        consent was obtained.                       - ASA Grade Assessment: II - A patient with mild                        systemic disease.                       After obtaining informed consent, the endoscope was                        passed under direct vision. Throughout the procedure,                        the patient's blood pressure, pulse, and oxygen                        saturations were monitored continuously. The Endoscope                        was introduced through the mouth, and advanced to the  third part of duodenum. The upper GI endoscopy was                        accomplished with ease. The patient tolerated the                        procedure well. Findings:      The esophagus was normal.      The examined duodenum was normal.      Localized  moderately erythematous mucosa without bleeding was found in       the gastric antrum. Biopsies were taken with a cold forceps for       histology.      The cardia and gastric fundus were normal on retroflexion. Impression:           - Normal esophagus.                       - Normal examined duodenum.                       - Erythematous mucosa in the antrum. Biopsied. Recommendation:       - Await pathology results.                       - Perform a colonoscopy today. Procedure Code(s):    --- Professional ---                       [phone], Esophagogastroduodenoscopy, flexible, transoral;                        with biopsy, single or multiple Diagnosis Code(s):    --- Professional ---                       K31.89, Other diseases of stomach and duodenum                       D50.9, Iron deficiency anemia, unspecified CPT copyright 2017 American Medical Association. All rights reserved. The codes documented in this report are preliminary and upon coder review may  be revised to meet current compliance requirements. Wyline Mood, MD Wyline Mood MD, MD 03/15/2018 10:20:05 AM This report has been signed electronically. Number of Addenda: 0 Note Initiated On: 03/15/2018 10:09 AM      [facility]

## 2018-03-15 NOTE — Op Note (Signed)
[facility] Gastroenterology Patient Name: Frank Miles Procedure Date: 03/15/2018 10:08 AM MRN: [id number] Account #: [id number] Date of Birth: 12-19-1960 Admit Type: Outpatient Age: 57 Room: [facility] ENDO ROOM 4 Gender: Male Note Status: Finalized Procedure:            Colonoscopy Indications:          Iron deficiency anemia Providers:            Wyline Mood MD, MD Referring MD:         No Local Md, MD (Referring MD) Medicines:            Monitored Anesthesia Care Complications:        No immediate complications. Procedure:            Pre-Anesthesia Assessment:                       - Prior to the procedure, a History and Physical was                        performed, and patient medications, allergies and                        sensitivities were reviewed. The patient's tolerance of                        previous anesthesia was reviewed.                       - The risks and benefits of the procedure and the                        sedation options and risks were discussed with the                        patient. All questions were answered and informed                        consent was obtained.                       - ASA Grade Assessment: II - A patient with mild                        systemic disease.                       After obtaining informed consent, the colonoscope was                        passed under direct vision. Throughout the procedure,                        the patient's blood pressure, pulse, and oxygen                        saturations were monitored continuously. The                        Colonoscope was introduced through the anus and  advanced to the the cecum, identified by the                        appendiceal orifice, IC valve and transillumination.                        The colonoscopy was performed with ease. The patient                        tolerated the procedure well. The quality of the bowel                    preparation was poor. Findings:      The perianal and digital rectal examinations were normal.      Internal hemorrhoids were found during retroflexion. The hemorrhoids       were medium-sized and Grade I (internal hemorrhoids that do not       prolapse).      The exam was otherwise without abnormality on direct and retroflexion       views. Impression:           - Preparation of the colon was poor.                       - Internal hemorrhoids.                       - The examination was otherwise normal on direct and                        retroflexion views.                       - No specimens collected. Recommendation:       - Discharge patient to home (with escort).                       - Resume previous diet.                       - Continue present medications.                       - Repeat colonoscopy in 6 months because the bowel                        preparation was suboptimal.                       - To visualize the small bowel, perform video capsule                        endoscopy in 2 weeks. Procedure Code(s):    --- Professional ---                       [phone], Colonoscopy, flexible; diagnostic, including                        collection of specimen(s) by brushing or washing, when                        performed (separate procedure) Diagnosis Code(s):    --- Professional ---  K64.0, First degree hemorrhoids                       D50.9, Iron deficiency anemia, unspecified CPT copyright 2017 American Medical Association. All rights reserved. The codes documented in this report are preliminary and upon coder review may  be revised to meet current compliance requirements. Wyline Mood, MD Wyline Mood MD, MD 03/15/2018 10:33:18 AM This report has been signed electronically. Number of Addenda: 0 Note Initiated On: 03/15/2018 10:08 AM Scope Withdrawal Time: 0 hours 7 minutes 11 seconds  Total Procedure Duration: 0 hours 10 minutes 29  seconds       [facility]

## 2018-03-15 NOTE — H&P (Signed)
Frank Mood, MD [address], [place], [place], [address] [address], [address], [place], [place], [address] Phone: [phone]  Fax: [phone]  Primary Care Physician:  System, Pcp Not In   Pre-Procedure History & Physical: HPI:  GABRIEN Miles is a 57 y.o. male is here for an endoscopy and colonoscopy    Past Medical History:  Diagnosis Date  . Diabetes mellitus without complication [facility])     Past Surgical History:  Procedure Laterality Date  . STENT PLACEMENT VASCULAR ([facility] HX)  07/23/2015   Cardiac Stent Placed    Prior to Admission medications   Medication Sig Start Date End Date Taking? Authorizing Provider  aspirin 81 MG tablet Take 81 mg by mouth daily.   Yes [provider]  atorvastatin (LIPITOR) 80 MG tablet  02/23/18  Yes [provider]  cyclobenzaprine (FLEXERIL) 10 MG tablet cyclobenzaprine 10 mg tablet   Yes [provider]  GLIPIZIDE XL 5 MG 24 hr tablet  02/21/18  Yes [provider]  lisinopril (PRINIVIL,ZESTRIL) 20 MG tablet  02/25/18  Yes [provider]  metFORMIN (GLUCOPHAGE) 500 MG tablet metformin 500 mg tablet 12/05/16  Yes [provider]  Difluprednate (DUREZOL) 0.05 % EMUL Durezol 0.05 % eye drops    [provider]  meloxicam (MOBIC) 15 MG tablet meloxicam 15 mg tablet    [provider]  naproxen (NAPROSYN) 500 MG tablet Take 1 tablet (500 mg total) by mouth 2 (two) times daily with a meal. Patient not taking: Reported on 03/08/2018 05/08/15   Tommi Rumps, PA-C  traMADol (ULTRAM) 50 MG tablet tramadol 50 mg tablet    [provider]    Allergies as of 03/09/2018 - Review Complete 03/08/2018  Allergen Reaction Noted  . Bee venom Swelling 05/08/2015    History reviewed. No pertinent family history.  Social History   Socioeconomic History  . Marital status: Divorced    Spouse name: Not on file  . Number of children: Not on file  .  Years of education: Not on file  . Highest education level: Not on file  Occupational History  . Not on file  Social Needs  . Financial resource strain: Not on file  . Food insecurity:    Worry: Not on file    Inability: Not on file  . Transportation needs:    Medical: Not on file    Non-medical: Not on file  Tobacco Use  . Smoking status: Former Smoker    Packs/day: 0.25    Types: Cigarettes    Last attempt to quit: 10/07/2016    Years since quitting: 1.4  . Smokeless tobacco: Never Used  Substance and Sexual Activity  . Alcohol use: No  . Drug use: Not Currently  . Sexual activity: Yes  Lifestyle  . Physical activity:    Days per week: Not on file    Minutes per session: Not on file  . Stress: Not on file  Relationships  . Social connections:    Talks on phone: Not on file    Gets together: Not on file    Attends religious service: Not on file    Active member of club or organization: Not on file    Attends meetings of clubs or organizations: Not on file    Relationship status: Not on file  . Intimate partner violence:    Fear of current or ex partner: Not on file    Emotionally abused: Not  on file    Physically abused: Not on file    Forced sexual activity: Not on file  Other Topics Concern  . Not on file  Social History Narrative  . Not on file    Review of Systems: See HPI, otherwise negative ROS  Physical Exam: BP (!) 152/78   Pulse 74   Temp 97.7 F (36.5 C) (Oral)   Resp 16   Ht  (1.702 m)   Wt 200 lb (90.7 kg)   SpO2 100%   BMI 31.32 kg/m  General:   Alert,  pleasant and cooperative in NAD Head:  Normocephalic and atraumatic. Neck:  Supple; no masses or thyromegaly. Lungs:  Clear throughout to auscultation, normal respiratory effort.    Heart:  +S1, +S2, Regular rate and rhythm, No edema. Abdomen:  Soft, nontender and nondistended. Normal bowel sounds, without guarding, and without rebound.   Neurologic:  Alert and  oriented x4;  grossly  normal neurologically.  Impression/Plan: Frank Miles is here for an endoscopy and colonoscopy  to be performed for  evaluation of iron deficiency anemia    Risks, benefits, limitations, and alternatives regarding endoscopy have been reviewed with the patient.  Questions have been answered.  All parties agreeable.   Frank Mood, MD  03/15/2018, 9:02 AM

## 2018-03-15 NOTE — Transfer of Care (Signed)
Immediate Anesthesia Transfer of Care Note  Patient: Frank Miles  Procedure(s) Performed: ESOPHAGOGASTRODUODENOSCOPY (EGD) WITH PROPOFOL (N/A ) COLONOSCOPY WITH PROPOFOL (N/A )  Patient Location: PACU  Anesthesia Type:General  Level of Consciousness: awake, alert  and oriented  Airway & Oxygen Therapy: Patient Spontanous Breathing and Patient connected to nasal cannula oxygen  Post-op Assessment: Report given to RN and Post -op Vital signs reviewed and stable  Post vital signs: Reviewed and stable  Last Vitals:  Vitals Value Taken Time  BP 107/70 03/15/2018 10:36 AM  Temp 36 C 03/15/2018 10:36 AM  Pulse 81 03/15/2018 10:36 AM  Resp 21 03/15/2018 10:36 AM  SpO2 98 % 03/15/2018 10:36 AM  Vitals shown include unvalidated device data.  Last Pain:  Vitals:   03/15/18 1030  TempSrc: (P) Tympanic  PainSc:          Complications: No apparent anesthesia complications

## 2018-03-15 NOTE — Anesthesia Postprocedure Evaluation (Signed)
Anesthesia Post Note  Patient: Frank Miles  Procedure(s) Performed: ESOPHAGOGASTRODUODENOSCOPY (EGD) WITH PROPOFOL (N/A ) COLONOSCOPY WITH PROPOFOL (N/A )  Patient location during evaluation: Endoscopy Anesthesia Type: General Level of consciousness: awake and alert Pain management: pain level controlled Vital Signs Assessment: post-procedure vital signs reviewed and stable Respiratory status: spontaneous breathing, nonlabored ventilation, respiratory function stable and patient connected to nasal cannula oxygen Cardiovascular status: blood pressure returned to baseline and stable Postop Assessment: no apparent nausea or vomiting Anesthetic complications: no     Last Vitals:  Vitals:   03/15/18 1056 03/15/18 1106  BP: (!) 126/91 (!) 144/77  Pulse: 88 71  Resp: (!) 26 18  Temp:    SpO2: 100% 100%    Last Pain:  Vitals:   03/15/18 1030  TempSrc: Tympanic  PainSc:                  Lenard Simmer

## 2018-03-15 NOTE — Anesthesia Preprocedure Evaluation (Signed)
Anesthesia Evaluation  Patient identified by MRN, date of birth, ID band Patient awake    Reviewed: Allergy & Precautions, H&P , NPO status , Patient's Chart, lab work & pertinent test results, reviewed documented beta blocker date and time   History of Anesthesia Complications Negative for: history of anesthetic complications  Airway Mallampati: I  TM Distance: >3 FB Neck ROM: full    Dental  (+) Dental Advidsory Given, Caps, Partial Upper, Teeth Intact, Missing   Pulmonary neg pulmonary ROS, former smoker,           Cardiovascular Exercise Tolerance: Good hypertension, (-) angina+ CAD, + Past MI and + Cardiac Stents  (-) CABG (-) dysrhythmias (-) Valvular Problems/Murmurs     Neuro/Psych negative neurological ROS  negative psych ROS   GI/Hepatic negative GI ROS, Neg liver ROS,   Endo/Other  diabetes, Oral Hypoglycemic Agents  Renal/GU negative Renal ROS  negative genitourinary   Musculoskeletal   Abdominal   Peds  Hematology negative hematology ROS (+)   Anesthesia Other Findings Past Medical History: No date: Diabetes mellitus without complication (HCC)   Reproductive/Obstetrics negative OB ROS                             Anesthesia Physical Anesthesia Plan  ASA: II  Anesthesia Plan: General   Post-op Pain Management:    Induction: Intravenous  PONV Risk Score and Plan: 2 and Propofol infusion  Airway Management Planned: Nasal Cannula  Additional Equipment:   Intra-op Plan:   Post-operative Plan:   Informed Consent: I have reviewed the patients History and Physical, chart, labs and discussed the procedure including the risks, benefits and alternatives for the proposed anesthesia with the patient or authorized representative who has indicated his/her understanding and acceptance.   Dental Advisory Given  Plan Discussed with: Anesthesiologist, CRNA and  Surgeon  Anesthesia Plan Comments:         Anesthesia Quick Evaluation

## 2018-03-16 ENCOUNTER — Ambulatory Visit
Admission: RE | Admit: 2018-03-16 | Discharge: 2018-03-16 | Disposition: A | Discharge status: Home / Self Care | Payer: BLUE CROSS/BLUE SHIELD | Source: Ambulatory Visit | Attending: Gastroenterology | Admitting: Gastroenterology

## 2018-03-16 DIAGNOSIS — D696 Thrombocytopenia, unspecified: Secondary | ICD-10-CM | POA: Diagnosis present

## 2018-03-16 LAB — SURGICAL PATHOLOGY

## 2018-03-17 ENCOUNTER — Encounter: Payer: Self-pay | Admitting: Gastroenterology

## 2018-03-17 LAB — HEPATIC FUNCTION PANEL
ALBUMIN: 4.3 g/dL (ref 3.5–5.5)
ALK PHOS: 61 IU/L (ref 39–117)
ALT: 27 IU/L (ref 0–44)
AST: 24 IU/L (ref 0–40)
BILIRUBIN TOTAL: 1.1 mg/dL (ref 0.0–1.2)
BILIRUBIN, DIRECT: 0.28 mg/dL (ref 0.00–0.40)
Total Protein: 6.9 g/dL (ref 6.0–8.5)

## 2018-03-17 LAB — CBC WITH DIFFERENTIAL/PLATELET
BASOS ABS: 0 10*3/uL (ref 0.0–0.2)
Basos: 0 %
EOS (ABSOLUTE): 0.2 10*3/uL (ref 0.0–0.4)
EOS: 2 %
HEMATOCRIT: 45.8 % (ref 37.5–51.0)
HEMOGLOBIN: 15.5 g/dL (ref 13.0–17.7)
Immature Grans (Abs): 0 10*3/uL (ref 0.0–0.1)
Immature Granulocytes: 0 %
LYMPHS ABS: 2.1 10*3/uL (ref 0.7–3.1)
Lymphs: 29 %
MCH: 27.5 pg (ref 26.6–33.0)
MCHC: 33.8 g/dL (ref 31.5–35.7)
MCV: 81 fL (ref 79–97)
MONOCYTES: 7 %
Monocytes Absolute: 0.5 10*3/uL (ref 0.1–0.9)
NEUTROS ABS: 4.5 10*3/uL (ref 1.4–7.0)
Neutrophils: 62 %
Platelets: 163 10*3/uL (ref 150–379)
RBC: 5.64 x10E6/uL (ref 4.14–5.80)
RDW: 15.5 % — ABNORMAL HIGH (ref 12.3–15.4)
WBC: 7.3 10*3/uL (ref 3.4–10.8)

## 2018-03-17 LAB — CELIAC DISEASE PANEL
ENDOMYSIAL IGA: NEGATIVE
IgA/Immunoglobulin A, Serum: 176 mg/dL (ref 90–386)

## 2018-03-17 LAB — MICROSCOPIC EXAMINATION: CASTS: NONE SEEN /LPF

## 2018-03-17 LAB — URINALYSIS, ROUTINE W REFLEX MICROSCOPIC
Bilirubin, UA: NEGATIVE
GLUCOSE, UA: NEGATIVE
Ketones, UA: NEGATIVE
Nitrite, UA: NEGATIVE
PROTEIN UA: NEGATIVE
RBC, UA: NEGATIVE
SPEC GRAV UA: 1.025 (ref 1.005–1.030)
Urobilinogen, Ur: 0.2 mg/dL (ref 0.2–1.0)
pH, UA: 5.5 (ref 5.0–7.5)

## 2018-03-17 LAB — B12 AND FOLATE PANEL
Folate: 14.1 ng/mL (ref >3.0)
Vitamin B-12: 455 pg/mL (ref 232–1245)

## 2018-03-17 LAB — PROTIME-INR
INR: 1.1 (ref 0.8–1.2)
Prothrombin Time: 11.6 s (ref 9.1–12.0)

## 2018-03-19 ENCOUNTER — Telehealth: Payer: Self-pay

## 2018-03-19 DIAGNOSIS — A048 Other specified bacterial intestinal infections: Secondary | ICD-10-CM

## 2018-03-19 MED ORDER — CLARITHROMYCIN 500 MG PO TABS: 500.0000 mg | ORAL_TABLET | Freq: Two times a day (BID) | ORAL | 0 refills | Status: AC

## 2018-03-19 MED ORDER — OMEPRAZOLE 20 MG PO CPDR: 20.0000 mg | DELAYED_RELEASE_CAPSULE | Freq: Two times a day (BID) | ORAL | 0 refills | Status: DC

## 2018-03-19 MED ORDER — AMOXICILLIN 500 MG PO TABS: 1000.0000 mg | ORAL_TABLET | Freq: Two times a day (BID) | ORAL | 0 refills | Status: AC

## 2018-03-19 NOTE — Telephone Encounter (Signed)
Advised patient of results per Dr. Tobi Bastos.    - Suggest clarithromycin 500 mg PO BID, amoxicillin 1 gram BID, omeprazole 20 mg BID all for 14 days. , check for penicillin allergy and will need repeat H pylori stool antigen to check for eradication after .   - No penicillin allergy.

## 2018-03-20 ENCOUNTER — Encounter: Payer: Self-pay | Admitting: Gastroenterology

## 2018-04-19 LAB — LIPID PANEL
Cholesterol: 90 (ref 0–200)
HDL: 38 (ref 35–70)
LDL Cholesterol: 29
Triglycerides: 115 (ref 40–160)

## 2018-04-19 LAB — HEMOGLOBIN A1C: Hemoglobin A1C: 6.2

## 2018-04-19 LAB — MICROALBUMIN, URINE: Microalb, Ur: 4.5

## 2018-04-19 LAB — PSA: PSA: 0.9

## 2018-04-19 LAB — TSH: TSH: 1.76 (ref 0.41–5.90)

## 2018-04-24 ENCOUNTER — Ambulatory Visit: Payer: BLUE CROSS/BLUE SHIELD | Admitting: Gastroenterology

## 2018-04-24 ENCOUNTER — Encounter: Payer: Self-pay | Admitting: Gastroenterology

## 2018-04-24 ENCOUNTER — Other Ambulatory Visit: Payer: Self-pay

## 2018-04-24 VITALS — BP 115/69 | HR 86 | Temp 98.1°F | Ht 68.0 in | Wt 207.6 lb

## 2018-04-24 DIAGNOSIS — D509 Iron deficiency anemia, unspecified: Secondary | ICD-10-CM

## 2018-04-24 DIAGNOSIS — A048 Other specified bacterial intestinal infections: Secondary | ICD-10-CM

## 2018-04-24 NOTE — Progress Notes (Signed)
Wyline Mood MD, MRCP(U.K) [address]  [address]  [place], [place] [address]  Main: [phone]  Fax: [phone]   Primary Care Physician: System, Pcp Not In  Primary Gastroenterologist:  Dr. Wyline Mood   No chief complaint on file.   HPI: Frank Miles is a 57 y.o. male   Summary of history :  He was initially seen on 03/08/18 for stool occult test card that were positive, Labs in 08/2017 showed an iron deficiency anemia, was commenced on iron at that time. Unclear if any evaluation done for iron deficiency anemia since 08/2017(ferritin 9) . Denies any overt blood loss in stool or urine. Last colonoscopy 2015 normal. No family history of colon cancer. Denies any NSAID use . He used to be on Plavix . On asprin . He has cardiac stents. Last drink of alcohol 13 years back. Consumes a lot of alcohol prior to that    Interval history   03/08/2018-  04/24/2018   03/14/18- Celiac serology , b12,folate, urine- normal . Low platelet count.  RUQ USG - no features of cirrhosis . No gall stones  03/15/09- EGD: Normal -biopsies showed chronic active gastritis with H pylori. Colonoscopy- poor prep - internal hemorroids    Completed antibiotics for H pylori . Says his iron studies are better. Has stopped iron tablets.   Current Outpatient Medications  Medication Sig Dispense Refill  . amoxicillin (AMOXIL) 500 MG capsule   0  . aspirin 81 MG tablet Take 81 mg by mouth daily.    Marland Kitchen atorvastatin (LIPITOR) 80 MG tablet   2  . clopidogrel (PLAVIX) 75 MG tablet Take 75 mg by mouth daily.  3  . cyclobenzaprine (FLEXERIL) 10 MG tablet cyclobenzaprine 10 mg tablet    . Difluprednate (DUREZOL) 0.05 % EMUL Durezol 0.05 % eye drops    . GLIPIZIDE XL 5 MG 24 hr tablet   5  . lisinopril (PRINIVIL,ZESTRIL) 20 MG tablet   5  . meloxicam (MOBIC) 15 MG tablet meloxicam 15 mg tablet    . metFORMIN (GLUCOPHAGE) 500 MG tablet metformin 500 mg tablet    . naproxen (NAPROSYN) 500 MG tablet Take 1  tablet (500 mg total) by mouth 2 (two) times daily with a meal. (Patient not taking: Reported on 03/08/2018) 30 tablet 0  . omeprazole (PRILOSEC) 20 MG capsule Take 1 capsule (20 mg total) by mouth 2 (two) times daily before a meal for 14 days. 28 capsule 0  . traMADol (ULTRAM) 50 MG tablet tramadol 50 mg tablet     No current facility-administered medications for this visit.     Allergies as of 04/24/2018 - Review Complete 03/15/2018  Allergen Reaction Noted  . Bee venom Swelling 05/08/2015    ROS:  General: Negative for anorexia, weight loss, fever, chills, fatigue, weakness. ENT: Negative for hoarseness, difficulty swallowing , nasal congestion. CV: Negative for chest pain, angina, palpitations, dyspnea on exertion, peripheral edema.  Respiratory: Negative for dyspnea at rest, dyspnea on exertion, cough, sputum, wheezing.  GI: See history of present illness. GU:  Negative for dysuria, hematuria, urinary incontinence, urinary frequency, nocturnal urination.  Endo: Negative for unusual weight change.    Physical Examination:   There were no vitals taken for this visit.  General: Well-nourished, well-developed in no acute distress.  Eyes: No icterus. Conjunctivae pink. Mouth: Oropharyngeal mucosa moist and pink , no lesions erythema or exudate. Lungs: Clear to auscultation bilaterally. Non-labored. Heart: Regular rate and rhythm, no murmurs rubs or gallops.  Abdomen: Bowel sounds are normal, nontender, nondistended, no hepatosplenomegaly or masses, no abdominal bruits or hernia , no rebound or guarding.   Extremities: No lower extremity edema. No clubbing or deformities. Neuro: Alert and oriented x 3.  Grossly intact. Skin: Warm and dry, no jaundice.   Psych: Alert and cooperative, normal mood and affect.   Imaging Studies: No results found.  Assessment and Plan:   Frank Miles is a 57 y.o. y/o male for  iron deficiency since 08/2017 . No overt blood loss. H/o alcohol  in excess till 13 years back . EGD+colonoscopy -negative  Plan  1. Check stool for H pylori eradication  2. Capsule study of the small bowel  3. Repeat colonoscopy before 09/2018 due to poor prep .    Risks, benefits, alternatives of Givens capsule discussed with patient to include but not limited to the rare risk of Given's capsule becoming lodged in the GI tract requiring surgical removal.  The patient agrees with this plan & consent will be obtained.    Dr Wyline MoodKiran Dyrell Tuccillo  MD,MRCP [facility](U.K) Follow up in 6 weeks

## 2018-05-08 ENCOUNTER — Encounter
Admission: RE | Disposition: A | Discharge status: Home / Self Care | Payer: Self-pay | Source: Ambulatory Visit | Attending: Gastroenterology

## 2018-05-08 ENCOUNTER — Telehealth: Payer: Self-pay | Admitting: Gastroenterology

## 2018-05-08 ENCOUNTER — Ambulatory Visit
Admission: RE | Admit: 2018-05-08 | Discharge: 2018-05-08 | Disposition: A | Discharge status: Home / Self Care | Payer: BLUE CROSS/BLUE SHIELD | Source: Ambulatory Visit | Attending: Gastroenterology | Admitting: Gastroenterology

## 2018-05-08 SURGERY — IMAGING PROCEDURE, GI TRACT, INTRALUMINAL, VIA CAPSULE
Anesthesia: General

## 2018-05-08 NOTE — Telephone Encounter (Signed)
Robin from [facility] calling stating pt came in for capsule study today but due to confusion at the front desk they told pt he could not be seen unless he had a care partner with him. Pt left. Robin left message for patient to come back in or reschedule. FYI

## 2018-05-16 ENCOUNTER — Encounter: Payer: Self-pay | Admitting: Gastroenterology

## 2018-05-16 LAB — H. PYLORI ANTIGEN, STOOL: H PYLORI AG STL: NEGATIVE

## 2018-05-18 ENCOUNTER — Other Ambulatory Visit: Payer: Self-pay

## 2018-05-18 DIAGNOSIS — D509 Iron deficiency anemia, unspecified: Secondary | ICD-10-CM

## 2018-05-18 NOTE — Telephone Encounter (Signed)
LVM for pt to contact office to inform him of rescheduled capsule study.  Rescheduled to 05/29/18.

## 2018-05-29 ENCOUNTER — Encounter: Admission: RE | Payer: Self-pay | Source: Ambulatory Visit

## 2018-05-29 ENCOUNTER — Ambulatory Visit
Admission: RE | Admit: 2018-05-29 | Payer: BLUE CROSS/BLUE SHIELD | Source: Ambulatory Visit | Admitting: Gastroenterology

## 2018-05-29 SURGERY — IMAGING PROCEDURE, GI TRACT, INTRALUMINAL, VIA CAPSULE
Anesthesia: General

## 2018-06-05 ENCOUNTER — Ambulatory Visit: Payer: BLUE CROSS/BLUE SHIELD | Admitting: Gastroenterology

## 2018-10-18 LAB — HEMOGLOBIN A1C: Hemoglobin A1C: 6.6

## 2018-10-18 LAB — BASIC METABOLIC PANEL
BUN: 11 (ref 4–21)
Creatinine: 0.8 (ref 0.6–1.3)

## 2019-05-01 ENCOUNTER — Other Ambulatory Visit: Payer: Self-pay | Admitting: Registered Nurse

## 2019-05-02 NOTE — Telephone Encounter (Signed)
Is this your patient? Thanks.

## 2019-05-30 ENCOUNTER — Other Ambulatory Visit: Payer: Self-pay

## 2019-05-30 NOTE — Telephone Encounter (Signed)
Patient had stent placed 07/2015 and duel antiplatelet therapy after with ASA and Plavix.  For most, continuing 12 months after is done, and for some felt to be at higher risk, it may be continued for 18-24 months. He has not seen cardiology since about 6+ months after the procedure.   He has seen GI more recent past (had iron deficiency anemia and scopes done)  and they noted in their notes he used to be on Plavix and now on ASA.   I will not refill this medicine (plavix) as not feel dual anti-platelet therapy indicated presently and noting the issues above.  Please let him know this and he was to schedule his physical for June or July (as is diabetic) and can discuss then. Please make sure he is scheduled.

## 2019-05-30 NOTE — Telephone Encounter (Signed)
Contacted Frank Miles & explained to him that Dr. Roxan Hockey isn't renewing the Plavix.  Frank Miles said he does take a baby ASA qd.  Informed that he's overdue for his physical & he was aware - states I usually come in May.  I scheduled his physical - Labs on 06/13/2019 & MD Appt on 06/25/2019.  AMD

## 2019-05-31 ENCOUNTER — Other Ambulatory Visit: Payer: Self-pay

## 2019-05-31 MED ORDER — METFORMIN HCL 500 MG PO TABS: ORAL_TABLET | ORAL | 3 refills | Status: DC

## 2019-06-13 ENCOUNTER — Ambulatory Visit: Payer: Self-pay

## 2019-06-13 ENCOUNTER — Other Ambulatory Visit: Payer: Self-pay

## 2019-06-13 DIAGNOSIS — Z0189 Encounter for other specified special examinations: Secondary | ICD-10-CM

## 2019-06-13 LAB — POCT URINALYSIS DIPSTICK
Bilirubin, UA: NEGATIVE
Blood, UA: NEGATIVE
Glucose, UA: NEGATIVE
Ketones, UA: NEGATIVE
Leukocytes, UA: NEGATIVE
Nitrite, UA: NEGATIVE
Protein, UA: NEGATIVE
Spec Grav, UA: 1.025 (ref 1.010–1.025)
Urobilinogen, UA: 0.2 E.U./dL
pH, UA: 6 (ref 5.0–8.0)

## 2019-06-13 NOTE — Progress Notes (Signed)
Labwork, EKG & Annual Hearing Screen for upcoming Annual Physical.  AMD

## 2019-06-14 LAB — CMP12+LP+TP+TSH+6AC+PSA+CBC…
ALT: 55 IU/L — ABNORMAL HIGH (ref 0–44)
AST: 42 IU/L — ABNORMAL HIGH (ref 0–40)
Albumin/Globulin Ratio: 1.8 (ref 1.2–2.2)
Albumin: 4.4 g/dL (ref 3.8–4.9)
BUN/Creatinine Ratio: 17 (ref 9–20)
Basophils Absolute: 0 10*3/uL (ref 0.0–0.2)
Basos: 1 %
Bilirubin Total: 1.3 mg/dL — ABNORMAL HIGH (ref 0.0–1.2)
Chol/HDL Ratio: 2.2 ratio (ref 0.0–5.0)
Cholesterol, Total: 73 mg/dL — ABNORMAL LOW (ref 100–199)
Creatinine, Ser: 0.88 mg/dL (ref 0.76–1.27)
Eos: 2 %
Estimated CHD Risk: <0.5 times avg. (ref 0.0–1.0)
Free Thyroxine Index: 2.4 (ref 1.2–4.9)
GFR calc Af Amer: 111 mL/min/{1.73_m2} (ref >59)
GFR calc non Af Amer: 96 mL/min/{1.73_m2} (ref >59)
GGT: 55 IU/L (ref 0–65)
Globulin, Total: 2.5 g/dL (ref 1.5–4.5)
Glucose: 138 mg/dL — ABNORMAL HIGH (ref 65–99)
HDL: 33 mg/dL — ABNORMAL LOW (ref >39)
Hemoglobin: 15.5 g/dL (ref 13.0–17.7)
Immature Grans (Abs): 0 10*3/uL (ref 0.0–0.1)
Immature Granulocytes: 0 %
Iron: 118 ug/dL (ref 38–169)
LDH: 209 IU/L (ref 121–224)
LDL Calculated: 27 mg/dL (ref 0–99)
Lymphs: 29 %
MCH: 27.1 pg (ref 26.6–33.0)
MCHC: 33 g/dL (ref 31.5–35.7)
Monocytes Absolute: 0.7 10*3/uL (ref 0.1–0.9)
Neutrophils Absolute: 4.8 10*3/uL (ref 1.4–7.0)
Neutrophils: 60 %
Phosphorus: 3.5 mg/dL (ref 2.8–4.1)
Platelets: 157 10*3/uL (ref 150–450)
Potassium: 4.3 mmol/L (ref 3.5–5.2)
Prostate Specific Ag, Serum: 0.9 ng/mL (ref 0.0–4.0)
RBC: 5.71 x10E6/uL (ref 4.14–5.80)
Sodium: 136 mmol/L (ref 134–144)
T3 Uptake Ratio: 28 % (ref 24–39)
T4, Total: 8.6 ug/dL (ref 4.5–12.0)
Triglycerides: 64 mg/dL (ref 0–149)
WBC: 8 10*3/uL (ref 3.4–10.8)

## 2019-06-14 LAB — CMP12+LP+TP+TSH+6AC+PSA+CBC?
Alkaline Phosphatase: 68 IU/L (ref 39–117)
BUN: 15 mg/dL (ref 6–24)
Calcium: 9.2 mg/dL (ref 8.7–10.2)
Chloride: 97 mmol/L (ref 96–106)
EOS (ABSOLUTE): 0.1 10*3/uL (ref 0.0–0.4)
Hematocrit: 47 % (ref 37.5–51.0)
Lymphocytes Absolute: 2.3 10*3/uL (ref 0.7–3.1)
MCV: 82 fL (ref 79–97)
Monocytes: 8 %
RDW: 14.6 % (ref 11.6–15.4)
TSH: 2.07 u[IU]/mL (ref 0.450–4.500)
Total Protein: 6.9 g/dL (ref 6.0–8.5)
Uric Acid: 6.5 mg/dL (ref 3.7–8.6)
VLDL Cholesterol Cal: 13 mg/dL (ref 5–40)

## 2019-06-14 LAB — MICROALBUMIN / CREATININE URINE RATIO
Creatinine, Urine: 119.8 mg/dL
Microalb/Creat Ratio: 13 mg/g creat (ref 0–29)
Microalbumin, Urine: 15.9 ug/mL

## 2019-06-14 LAB — HGB A1C W/O EAG: Hgb A1c MFr Bld: 7.5 % — ABNORMAL HIGH (ref 4.8–5.6)

## 2019-06-19 LAB — RETICULOCYTES: Reticulocyte Count: 1.6

## 2019-06-19 LAB — IRON AND TIBC
Iron Saturation: 7
Iron Saturation: 7
Iron: 31
Iron: 33
TIBC: 457
TIBC: 479
Transferrin: 392
UIBC: 426
UIBC: 446

## 2019-06-19 LAB — VITAMIN B12 DEFICIENCY PANEL
Folate: 14.3
VITAMIN B12: 513

## 2019-06-20 ENCOUNTER — Other Ambulatory Visit: Payer: Self-pay

## 2019-06-20 ENCOUNTER — Ambulatory Visit: Payer: 59 | Admitting: Internal Medicine

## 2019-06-20 ENCOUNTER — Encounter: Payer: Self-pay | Admitting: Internal Medicine

## 2019-06-20 VITALS — BP 125/81 | HR 71 | Temp 97.2°F | Resp 16 | Ht 68.0 in | Wt 206.0 lb

## 2019-06-20 DIAGNOSIS — E6609 Other obesity due to excess calories: Secondary | ICD-10-CM | POA: Insufficient documentation

## 2019-06-20 DIAGNOSIS — I10 Essential (primary) hypertension: Secondary | ICD-10-CM

## 2019-06-20 DIAGNOSIS — Z6831 Body mass index (BMI) 31.0-31.9, adult: Secondary | ICD-10-CM

## 2019-06-20 DIAGNOSIS — E7849 Other hyperlipidemia: Secondary | ICD-10-CM

## 2019-06-20 DIAGNOSIS — I25810 Atherosclerosis of coronary artery bypass graft(s) without angina pectoris: Secondary | ICD-10-CM

## 2019-06-20 DIAGNOSIS — Z862 Personal history of diseases of the blood and blood-forming organs and certain disorders involving the immune mechanism: Secondary | ICD-10-CM | POA: Insufficient documentation

## 2019-06-20 DIAGNOSIS — I2510 Atherosclerotic heart disease of native coronary artery without angina pectoris: Secondary | ICD-10-CM | POA: Insufficient documentation

## 2019-06-20 DIAGNOSIS — R748 Abnormal levels of other serum enzymes: Secondary | ICD-10-CM | POA: Insufficient documentation

## 2019-06-20 DIAGNOSIS — E118 Type 2 diabetes mellitus with unspecified complications: Secondary | ICD-10-CM

## 2019-06-20 DIAGNOSIS — K219 Gastro-esophageal reflux disease without esophagitis: Secondary | ICD-10-CM | POA: Insufficient documentation

## 2019-06-20 MED ORDER — GLIPIZIDE ER 10 MG PO TB24: 10.0000 mg | ORAL_TABLET | Freq: Every day | ORAL | 3 refills | Status: DC

## 2019-06-20 NOTE — Patient Instructions (Signed)
Decrease alcohol intake as discussed.  Increase exercise as planned also important.

## 2019-06-20 NOTE — Progress Notes (Signed)
S  -58 y.o. Black male who presents for annual physical evaluation  No specific complaints, denies any recent CP, palpitations, SOB, abdominal pains, change in bowel habits, dark/black stools, vision changes, recent fevers, or other Covid concerning sx's, no LE edema, not up at night to urinate, no hesitancy or frequency,   Noted went for his f/u colonoscopy prepped and in office and they told him not scheduled and sent home, then was called when home 15 minutes later to return as made a mistake and he did not go back. This was a f/u after the one 03/2018 that was an inadequate prep and f/u rec'ed in 6 months. The EGD 5/19 was done and ok (and he noted he is still paying for that). The small bowel capsule study was scheduled for July and that was cancelled in [place] (wonder if that was the study that he went for and then sent home, and not the f/u colonoscopy). He had a colonoscopy around 8 (believe in 2014 via chart note) and patient informed was ok. He is not anxious to repeat again until f/u rec'ed after 2014 study which is likely 10 years if was ok.  He has not had any recurring abdominal pains, any black or dark stools, and not anemic on recent check.   Exercise - no regular exercise recently, notes tired when gets home from work and use to walk and do more and vowed to return to this today  Diet - not adherent to a very strict healthy diet, tries to watch his sugars Alcohol - table wine daily, and a bottle every 3 days or so noted. Denied any other alcohol in addition to this  Meds reviewed Current Outpatient Medications on File Prior to Visit  Medication Sig Dispense Refill  . aspirin 81 MG tablet Take 81 mg by mouth daily.    Marland Kitchen atorvastatin (LIPITOR) 80 MG tablet   2  . GLIPIZIDE XL 5 MG 24 hr tablet   5  . lisinopril (ZESTRIL) 20 MG tablet Take 1 tablet by mouth once daily 90 tablet 3  . metFORMIN (GLUCOPHAGE) 500 MG tablet 2 tablets by mouth twice daily 360 tablet 3   No current  facility-administered medications on file prior to visit.      No Known Allergies  Social History   Tobacco Use  Smoking Status Former Smoker  . Packs/day: 0.25  . Types: Cigarettes  . Quit date: 10/07/2016  . Years since quitting: 2.7  Smokeless Tobacco Never Used     FH - + DM, HD - M, Alcohol abuse - D, siblings with depression, alcohol abuse, MI in brother, and lung and stomach cancer   O - NAD, masked  BP 125/81 (BP Location: Right Arm, Patient Position: Sitting, Cuff Size: Large)   Pulse 71   Temp (!) 97.2 F (36.2 C) (Oral)   Resp 16   Ht 5\' 8"  (1.727 m)   Wt 206 lb (93.4 kg)   SpO2 96%   BMI 31.32 kg/m    Weight - 209.6 - 10/2018, 203 - feb 2020  HEENT - sclera anicteric, PERRL, EOMI, conj - non-inj'ed, No sinus tenderness, TM's and canals clear Neck - supple, no adenopathy, no TM, carotids 2+ and = without bruits bilat Car - RRR without m/g/r Pulm- CTA without wheeze or rales Abd - soft, obese, NT, ND, BS+, no obvious HSM, no masses Back - no CVA tenderness Skin- no new lesions of concern on exposed areas, denied otherwise Ext - no  LE edema, no active joints, right 5th digit chronic flexion deformity (not new) GU - no swelling in inguinal/suprapubic region, NT,  testicle and prostate exams deferred (without concerning sx's and after discussion on current recommendations for prostate CA screening including PSA tests) Neuro - affect was not flat, appropriate with conversation  Grossly non-focal with good strength on testing, sensation intact to LT in distal extremities, Romberg neg, no pronator drift, good balance on one foot, good finger to nose   Labs reviewed - glc - 138, A1C- 7.5 (6.6 in 10/2018), bili - 1.3, AST-42, ALT-55 (normal prior), TC - 73, HDL - 33, LDL - 27, PSA - 0.9, H/H - 15.5/47.0, MCV and MCH normal, RDW normal. Urine microalb - not increased ECG reviewed - no concerning changes from prior ECG, noted incomplete RBBB and minor inf repol  disturbance, and not felt significantly changed from prior and no concerns clinically  Colonoscopy screening discussed and reviewed at length - was due for f/u 6 months after 03/2018 study and not completed (as inadeq study and this study done due to clinical concerns). He does not want to repeat at present and noted he will repeat when indicated after his first one that was done when he was 50 or 51.   Ass/Plan: 1. NIDDM - not as well controlled recent past, with increased A1C  Continue current medications and increase glipizide XR to 10mg  in am Cont statin with goal to keep LDL <70  On ACE inhib and cont to follow urine for albumin/microalb Importance of diet modifications emphasized and he will decrease the alcohol to help as well noted Importance of some regular aerobic exercise encouraged and he vowed to increase that today Above to help with weight control/weight loss also important  Discussed potential next steps if not better controlled and why important to keep sugars very well controlled to help protect the heart, kidneys, and lessen future complications. F/u in 3 months with A1C then to recheck  2. HTN - controlled on medicines  Continue medications to manage Discussed goals for good control of BP  Importance of healthy diet and regular aerobic exercise and weight control noted Continue to monitor  3. Increased BMI/obesity  The above lifestyle changes to help with weight maintenance and important for success  4. CAD, stent placed in 2016 - noted not need to see the cardiologist in recent past and was told not need to see unless more problems again  Cont ASA  5. Hyperlipidemia  statin to continue, goal of LDL < 70 Importance of some regular aerobic exercise encouraged  6. GERD hx - doing well off of medications presently and recent EGD (03/2018) was ok.   7. Increased LFT's noted - mild transaminase increase, also bili  He felt was due to his alcohol use and planned to  stop that presently and he really wanted to try this before any further w/u (like an U/S noted). Significant + FH of alcoholism noted as well.  Decreasing the alcohol to help with the sugars as well noted  8. H/o iron def anemia - prompted the GI w/u as noted above, ferritin was 9 at its lowest and improved on rechecks and not taking supplemental iron recently. H/H in 04/2018 and again above in 06/2019 are normal with normal RBC indices. Continue to follow.

## 2019-06-25 ENCOUNTER — Encounter: Payer: Self-pay | Admitting: Internal Medicine

## 2019-09-11 ENCOUNTER — Ambulatory Visit: Payer: 59

## 2019-09-26 ENCOUNTER — Encounter: Payer: Self-pay | Admitting: Physician Assistant

## 2019-09-26 ENCOUNTER — Ambulatory Visit: Payer: 59 | Admitting: Physician Assistant

## 2019-09-26 ENCOUNTER — Other Ambulatory Visit: Payer: Self-pay

## 2019-09-26 VITALS — BP 150/76 | HR 63 | Temp 97.9°F | Resp 16 | Ht 69.0 in | Wt 212.0 lb

## 2019-09-26 DIAGNOSIS — Z6831 Body mass index (BMI) 31.0-31.9, adult: Secondary | ICD-10-CM

## 2019-09-26 DIAGNOSIS — E6609 Other obesity due to excess calories: Secondary | ICD-10-CM

## 2019-09-26 DIAGNOSIS — R748 Abnormal levels of other serum enzymes: Secondary | ICD-10-CM

## 2019-09-26 DIAGNOSIS — E119 Type 2 diabetes mellitus without complications: Secondary | ICD-10-CM

## 2019-09-26 DIAGNOSIS — I10 Essential (primary) hypertension: Secondary | ICD-10-CM

## 2019-09-26 LAB — POCT GLYCOSYLATED HEMOGLOBIN (HGB A1C)
HbA1c POC (<> result, manual entry): 6.9 % (ref 4.0–5.6)
Hemoglobin A1C: 6.9 % — AB (ref 4.0–5.6)

## 2019-09-30 ENCOUNTER — Ambulatory Visit: Payer: 59

## 2019-10-02 ENCOUNTER — Ambulatory Visit: Payer: Self-pay

## 2019-10-11 NOTE — Progress Notes (Signed)
   Subjective:    Patient ID: Frank Miles, male    DOB: [birth year], 58 y.o.   MRN: [id number]  HPI    3 month F/u DM Had annual exam 06/20/19: wt 206  BP 125/81    A1C 7.5  Today weight reported at 212 ( but patient reports recent intentional loss from high of 218)     Recent active weight loss, dietary awareness and start of walking exercise daily appears to be reflected in current  A1C 6.9 improved despite weight actually higher than in August  Frank Miles feels well- is proud of his efforts and congratulated ! 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 He prepped for repeat study and then was told not scheduled on arrival. After he returned home they called to report a mistake and come back- but he refused. The appointment mixup and still paying for first studies -he deferred    Had EGD 5/19 reported as OK. Had a history of dark stools-no longer noted per patient and H/H 15/47 in August , no longer anemia    Objective:   Physical Exam Constitutional:      General: He is not in acute distress.    Appearance: Normal appearance.     Comments: overweight  Eyes:     Extraocular Movements: Extraocular movements intact.  Pulmonary:     Effort: Pulmonary effort is normal.  Musculoskeletal: Normal range of motion.  Skin:    General: Skin is warm.  Neurological:     Mental Status: He is alert.  Psychiatric:        Mood and Affect: Mood normal.        Behavior: Behavior normal.       Assessment & Plan:   Continue current medications as  ordered Monitor FBS at least twice a week- more often if out of range Plan a 3 month DM f/u to review weight and A1C for improvement. Has wine with dinner-remember to count  As calories and sugar-monitor carefully Continue walking program - at 5'9" will goal toward 200lbs  RTC 3 months for fasting labs and a follow up visit a week later-contact [facility] clinic to schedule

## 2019-12-17 ENCOUNTER — Ambulatory Visit: Payer: Self-pay

## 2019-12-24 ENCOUNTER — Ambulatory Visit: Payer: Self-pay

## 2019-12-24 NOTE — Progress Notes (Signed)
Patient comes in today for 3 month labs. Patient is scheduled with Albina Billet, PA-C on 01/01/2020.

## 2019-12-25 ENCOUNTER — Other Ambulatory Visit: Payer: Self-pay

## 2019-12-25 ENCOUNTER — Ambulatory Visit: Payer: 59

## 2019-12-25 DIAGNOSIS — E119 Type 2 diabetes mellitus without complications: Secondary | ICD-10-CM

## 2019-12-25 NOTE — Addendum Note (Signed)
Addended by: Nida Boatman on: 12/25/2019 09:16 AM   Modules accepted: Orders

## 2019-12-26 LAB — BUN+CREAT
BUN/Creatinine Ratio: 16 (ref 9–20)
BUN: 13 mg/dL (ref 6–24)
Creatinine, Ser: 0.83 mg/dL (ref 0.76–1.27)
GFR calc Af Amer: 113 mL/min/{1.73_m2} (ref >59)
GFR calc non Af Amer: 98 mL/min/{1.73_m2} (ref >59)

## 2019-12-26 LAB — HGB A1C W/O EAG: Hgb A1c MFr Bld: 8.3 % — ABNORMAL HIGH (ref 4.8–5.6)

## 2019-12-27 NOTE — Progress Notes (Signed)
Noted will reinforce diet/exercise and discuss further with patient at his appt.

## 2020-01-01 ENCOUNTER — Ambulatory Visit: Payer: Self-pay

## 2020-01-06 ENCOUNTER — Encounter: Payer: Self-pay | Admitting: Physician Assistant

## 2020-01-06 ENCOUNTER — Ambulatory Visit: Payer: 59 | Admitting: Physician Assistant

## 2020-01-06 ENCOUNTER — Other Ambulatory Visit: Payer: Self-pay

## 2020-01-06 VITALS — BP 142/70 | HR 70 | Temp 98.3°F | Resp 12 | Ht 69.0 in | Wt 211.0 lb

## 2020-01-06 DIAGNOSIS — E119 Type 2 diabetes mellitus without complications: Secondary | ICD-10-CM

## 2020-01-06 NOTE — Progress Notes (Signed)
Review 6 month DM post physical labs - 1c & BUN results.  AMD

## 2020-01-06 NOTE — Progress Notes (Signed)
Appt scheduled today with [facility] provider

## 2020-01-06 NOTE — Progress Notes (Signed)
  Subjective:     Patient ID: Frank Miles, male   DOB: 07-20-1961, 59 y.o.   MRN: [id number]  HPI  Frank Miles saw his A1C in My Chart - with  change from 6.9 in November to current 8.3 and it really got his attention  He takes his medications as ordered and stays hydrated Metformin  500mg  2 BID Gylipizide  10 mg 24 hr qd  Weight was 212 at home a few weeks ago, and down to 207 in early morning now. Office weight with clothes 211  He has seriously reviewed his diet and is focused on fresh not fried, no processed; no fast food. Wine and sweets were very high over the winter and have been eliminated He has a drink of lemon, cider vinegar and a teaspoon of miralax He takes daily ..just as his grandmother did.03-19-1986and that pleases him. .Has stopped all bread and rice. Does a good job of cooking with spices and herbs and enjoys trying new combinations- really enjoys salads now with lots of good things added. 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  working out in the home gym, lifting and repetetive  Upper body mostly- encourage walks or consider bike riding  Review of Systems Denies specific concerns    Objective:   211 pounds  WDWNM in NAD VSS Alert and interactive Appropriate questions and concerns     Assessment:   Diabetic control recently less vigorous and he appears to be taking it seriously.  Family history positive- he has seen what the disease can do to a body-mother  Questions fielded, recommendations reviewed Encourage walking 30 minutes brisk,daily   150 or more per week Prefer over weights work out for cardiovascular support- hx  NSTEMI and CAD    Plan:      He does not want to make any medication changes at this time  but wants to be personally responsible to show improvement at follow up  RTC in one month for A1C , weight check and F/U    Need to remind at next  visit to reschedule his colonoscopy which was not previously accomplished because of a clinic scheduling error.

## 2020-01-07 ENCOUNTER — Encounter: Payer: Self-pay | Admitting: Physician Assistant

## 2020-02-05 ENCOUNTER — Ambulatory Visit: Payer: Managed Care, Other (non HMO)

## 2020-02-06 ENCOUNTER — Encounter: Payer: Self-pay | Admitting: Physician Assistant

## 2020-02-06 ENCOUNTER — Ambulatory Visit: Payer: Self-pay | Admitting: Physician Assistant

## 2020-02-06 ENCOUNTER — Other Ambulatory Visit: Payer: Self-pay

## 2020-02-06 VITALS — BP 145/92 | HR 69 | Temp 97.1°F | Resp 12 | Ht 68.0 in | Wt 209.0 lb

## 2020-02-06 DIAGNOSIS — Z09 Encounter for follow-up examination after completed treatment for conditions other than malignant neoplasm: Secondary | ICD-10-CM

## 2020-02-06 DIAGNOSIS — E119 Type 2 diabetes mellitus without complications: Secondary | ICD-10-CM

## 2020-02-06 MED ORDER — ONETOUCH VERIO W/DEVICE KIT: 1.0000 | PACK | Freq: Every morning | 0 refills | Status: AC

## 2020-02-06 MED ORDER — GLUCOSE BLOOD VI STRP: ORAL_STRIP | 12 refills | Status: AC

## 2020-02-06 MED ORDER — ONETOUCH ULTRASOFT LANCETS MISC: 12 refills | Status: AC

## 2020-02-06 NOTE — Progress Notes (Signed)
Hass been weed eating this morning at [facility].  Has taken BP ;med this morning  AMD

## 2020-02-06 NOTE — Progress Notes (Signed)
   Subjective: Diabetes    Patient ID: Frank Miles, male    DOB: 06/26/1961, 59 y.o.   MRN: [id number]  HPI Patient presents for reevaluation of diabetes. Patient's hemoglobin A1c has increased from 6.9 to 8.3 in past four months.  Patient admits to intermittent noncompliance of medication, increase fluid intake, and decreased physical exercise.  It is coming to our attention that patient does not have a glucose monitoring device.  Last hemoglobin A1c was 12/25/2019.   Review of Systems Diabetes, hyperlipidemia, hypertension.    Objective:   Physical Exam No acute distress.  No weight change from previous visit.       Assessment & Plan: Diabetes  Patient diabetes is poorly controlled.  Spent approximately 15 minutes discussing rationale for having home unit to monitor glucose.  Discussed that this between hemoglobin A1c and daily monitoring.  Patient given a prescription for the One Touch Verio, test strips, and lancets.  Patient return back in mid May 2021 for reevaluation with a new hemoglobin A1c.  Advised patient to monitor glucose levels and record them for his next visit.

## 2020-02-18 ENCOUNTER — Ambulatory Visit: Payer: 59 | Attending: Internal Medicine

## 2020-02-18 DIAGNOSIS — Z23 Encounter for immunization: Secondary | ICD-10-CM

## 2020-02-18 NOTE — Progress Notes (Signed)
   Covid-19 Vaccination Clinic  Name:  ESLEY BROOKING    MRN: [id number] DOB: [birth year]  02/18/2020  Mr. Tauzin was observed post Covid-19 immunization for 15 minutes without incident. He was provided with Vaccine Information Sheet and instruction to access the V-Safe system.   Mr. Blumenfeld was instructed to call 911 with any severe reactions post vaccine: Marland Kitchen Difficulty breathing  . Swelling of face and throat  . A fast heartbeat  . A bad rash all over body  . Dizziness and weakness   Immunizations Administered    Name Date Dose VIS Date Route   Pfizer COVID-19 Vaccine 02/18/2020  9:21 AM 0.3 mL 10/18/2019 Intramuscular   Manufacturer: [employer], [employer]   Lot: [id number]   NDC: [address]-4

## 2020-03-12 ENCOUNTER — Other Ambulatory Visit: Payer: Self-pay

## 2020-03-12 ENCOUNTER — Other Ambulatory Visit: Payer: 59

## 2020-03-12 DIAGNOSIS — E119 Type 2 diabetes mellitus without complications: Secondary | ICD-10-CM

## 2020-03-12 NOTE — Progress Notes (Signed)
Scheduled to review labs 03/19/20 with Durward Parcel, PA-C.  AMD

## 2020-03-13 LAB — HGB A1C W/O EAG: Hgb A1c MFr Bld: 6.7 % — ABNORMAL HIGH (ref 4.8–5.6)

## 2020-03-18 ENCOUNTER — Encounter: Payer: Self-pay | Admitting: Emergency Medicine

## 2020-03-18 ENCOUNTER — Ambulatory Visit: Payer: 59 | Admitting: Emergency Medicine

## 2020-03-18 ENCOUNTER — Other Ambulatory Visit: Payer: Self-pay

## 2020-03-18 VITALS — BP 160/85 | HR 68 | Temp 98.5°F

## 2020-03-18 DIAGNOSIS — Z712 Person consulting for explanation of examination or test findings: Secondary | ICD-10-CM

## 2020-03-18 NOTE — Progress Notes (Signed)
Review lab results from 03/12/20 - A1c = 6.7  AMD

## 2020-03-18 NOTE — Progress Notes (Signed)
  Subjective: Follow-up     Patient ID: Frank Miles, male   DOB: [birth year], 59 y.o.   MRN: [id number]  HPI patient presents the occupational health clinic for recheck of his A1c.  He has been trying to do better with diet and exercise.  He denies any complaints.   Review of Systems     Objective:   Physical Exam  Unremarkable Recent Results (from the past 2160 hour(s))  Hemoglobin A1c     Status: Abnormal   Collection Time: 12/25/19  9:10 AM  Result Value Ref Range   Hgb A1c MFr Bld 8.3 (H) 4.8 - 5.6 %    Comment:          Prediabetes: 5.7 - 6.4          Diabetes: >6.4          Glycemic control for adults with diabetes: <7.0   BUN+Creat     Status: None   Collection Time: 12/25/19  9:10 AM  Result Value Ref Range   BUN 13 6 - 24 mg/dL   Creatinine, Ser 4.09 0.76 - 1.27 mg/dL   GFR calc non Af Amer 98 >59 mL/min/1.73   GFR calc Af Amer 113 >59 mL/min/1.73   BUN/Creatinine Ratio 16 9 - 20  Hgb A1c w/o eAG     Status: Abnormal   Collection Time: 03/12/20  8:34 AM  Result Value Ref Range   Hgb A1c MFr Bld 6.7 (H) 4.8 - 5.6 %    Comment:          Prediabetes: 5.7 - 6.4          Diabetes: >6.4          Glycemic control for adults with diabetes: <7.0        Assessment:     Medical screening exam, diabetes management    Plan:     Patient with A1c of 6.7.  This is improved from 3 months ago 8.3.  We will encourage him to continue what he is doing and increase exercise.  He is cleared for follow-up.

## 2020-03-19 ENCOUNTER — Other Ambulatory Visit: Payer: Self-pay

## 2020-03-19 ENCOUNTER — Ambulatory Visit: Payer: 59

## 2020-03-20 MED ORDER — ATORVASTATIN CALCIUM 80 MG PO TABS: 80.0000 mg | ORAL_TABLET | Freq: Every day | ORAL | 2 refills | Status: DC

## 2020-03-26 ENCOUNTER — Other Ambulatory Visit: Payer: Self-pay

## 2020-03-26 ENCOUNTER — Ambulatory Visit: Payer: Self-pay | Admitting: Emergency Medicine

## 2020-03-26 ENCOUNTER — Encounter: Payer: Self-pay | Admitting: Emergency Medicine

## 2020-03-26 VITALS — BP 146/89 | HR 66 | Temp 96.9°F | Resp 12 | Ht 69.0 in | Wt 208.0 lb

## 2020-03-26 DIAGNOSIS — M25512 Pain in left shoulder: Secondary | ICD-10-CM

## 2020-03-26 MED ORDER — CYCLOBENZAPRINE HCL 10 MG PO TABS
10.0000 mg | ORAL_TABLET | Freq: Three times a day (TID) | ORAL | 0 refills | Status: DC | PRN
Start: 2020-03-26 — End: 2021-05-20

## 2020-03-26 MED ORDER — IBUPROFEN 800 MG PO TABS: 800.0000 mg | ORAL_TABLET | Freq: Three times a day (TID) | ORAL | 0 refills | Status: DC | PRN

## 2020-03-26 NOTE — Progress Notes (Signed)
  [facility] Occupational Health Provider Note       Time seen: ----------------------------------------- 11:37 AM on @EDTODAY @ -----------------------------------------   I have reviewed the vital signs and the nursing notes.  HISTORY   Chief Complaint No chief complaint on file.   HPI Frank Miles is a 59 y.o. male with a history of anemia, CAD, DM, hyperlipidemia, GERD, HTN who presents today for left shoulder injury with work. Patient was lifting several days ago and injured his left shoulder.  He states he has not slept well because the pain.  Past Medical History:  Diagnosis Date  . Anemia   . CAD (coronary artery disease)   . Diabetes mellitus without complication (HCC)   . Elevated lipids   . GERD (gastroesophageal reflux disease)   . Hypertension     Past Surgical History:  Procedure Laterality Date  . COLONOSCOPY WITH PROPOFOL N/A 03/15/2018   Procedure: COLONOSCOPY WITH PROPOFOL;  Surgeon: 05/15/2018, MD;  Location: [facility] ENDOSCOPY;  Service: Gastroenterology;  Laterality: N/A;  . ESOPHAGOGASTRODUODENOSCOPY (EGD) WITH PROPOFOL N/A 03/15/2018   Procedure: ESOPHAGOGASTRODUODENOSCOPY (EGD) WITH PROPOFOL;  Surgeon: 05/15/2018, MD;  Location: [facility] ENDOSCOPY;  Service: Gastroenterology;  Laterality: N/A;  . STENT PLACEMENT VASCULAR ([facility] HX)  07/23/2015   Cardiac Stent Placed    Allergies Patient has no known allergies.  Review of Systems Constitutional: Negative for fever. Cardiovascular: Negative for chest pain. Respiratory: Negative for shortness of breath. Gastrointestinal: Negative for abdominal pain, vomiting and diarrhea. Musculoskeletal: Positive for left shoulder pain Skin: Negative for rash. Neurological: Negative for headaches, focal weakness or numbness.  All systems negative/normal/unremarkable except as stated in the HPI  ____________________________________________   PHYSICAL EXAM:  VITAL SIGNS: Vitals:   03/26/20 1143  BP:  (!) 146/89  Pulse: 66  Resp: 12  Temp: (!) 96.9 F (36.1 C)  SpO2: 98%    Constitutional: Alert and oriented. Well appearing and in no distress. Gastrointestinal: Soft and nontender. Normal bowel sounds Musculoskeletal: Pain with range of motion of the left shoulder, left trapezius muscle spasm is noted.  No obvious rotator cuff injury Neurologic:  Normal speech and language. No gross focal neurologic deficits are appreciated.  Skin:  Skin is warm, dry and intact. No rash noted. Psychiatric: Speech and behavior are normal.   DIFFERENTIAL DIAGNOSIS  Left shoulder strain, spasm  ASSESSMENT AND PLAN  Left shoulder strain, trapezius muscle spasm   Plan: The patient had presented for left shoulder pain.  Patient's been put on restricted duty x7 days, anti-inflammatory, muscle relaxant.  We will try to refer him to massage therapy.  03/28/20 MD    Note: This note was generated in part or whole with voice recognition software. Voice recognition is usually quite accurate but there are transcription errors that can and very often do occur. I apologize for any typographical errors that were not detected and corrected.

## 2020-04-02 IMAGING — US US ABDOMEN LIMITED
1 series · 14 of 25 positions shown · non-contrast
Comparison: None.

CLINICAL DATA: 56-year-old male with a history of thrombus
cytopenia and concern for cirrhosis

EXAM:
ULTRASOUND ABDOMEN LIMITED RIGHT UPPER QUADRANT

[Series 1: us abdomen limited · 0.20mm/px · 14 of 38 slices shown]
[im 1/38]
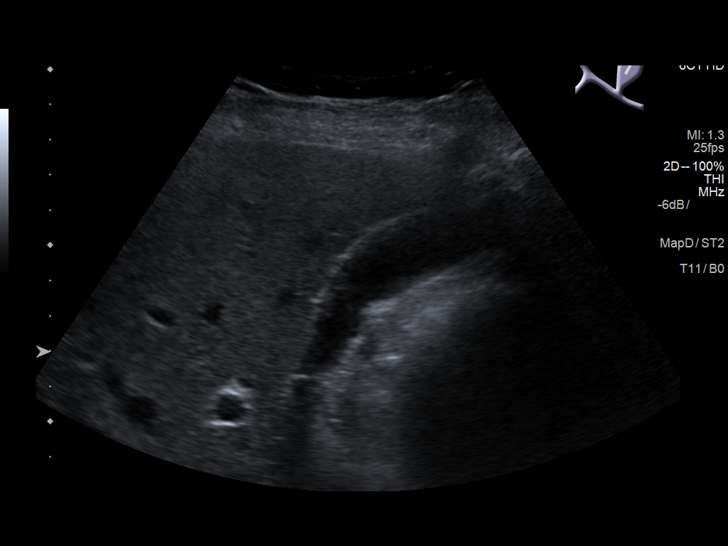
[im 4/38]
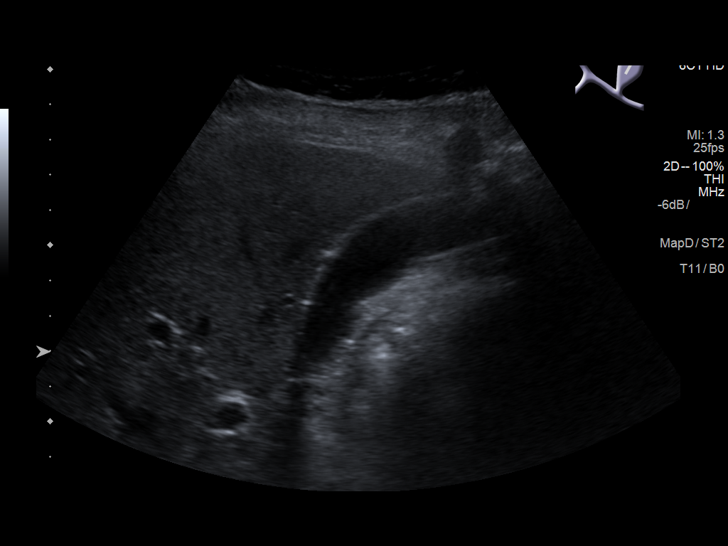
[im 7/38]
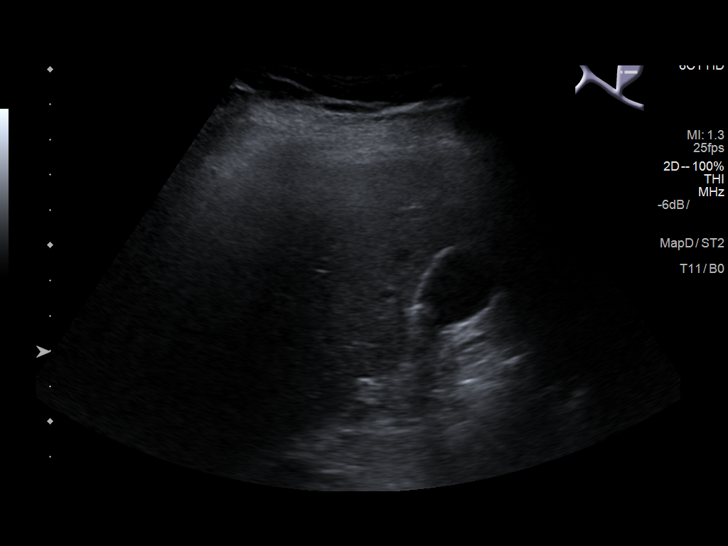
[im 10/38]
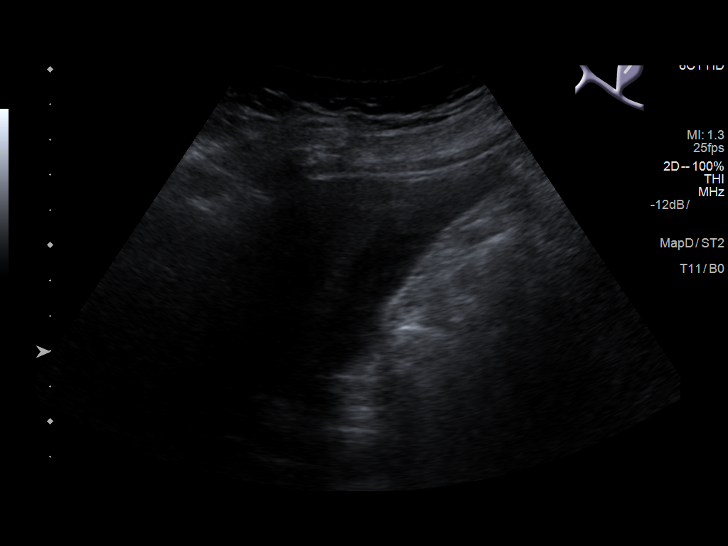
[im 13/38]
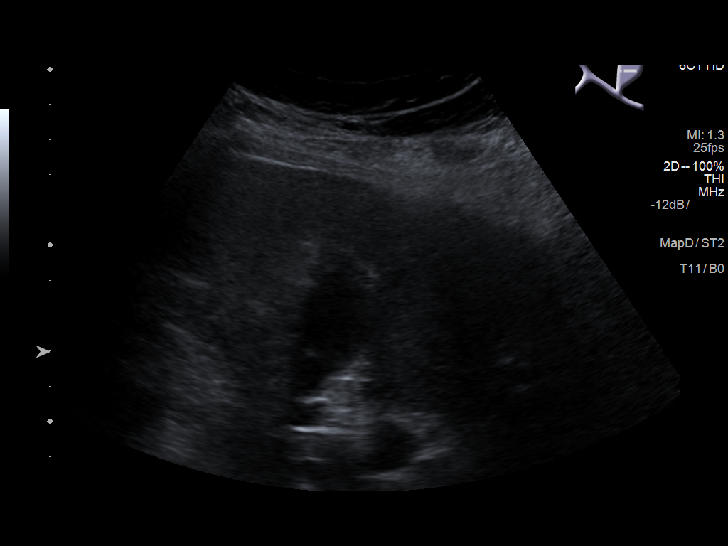
[im 14/38]
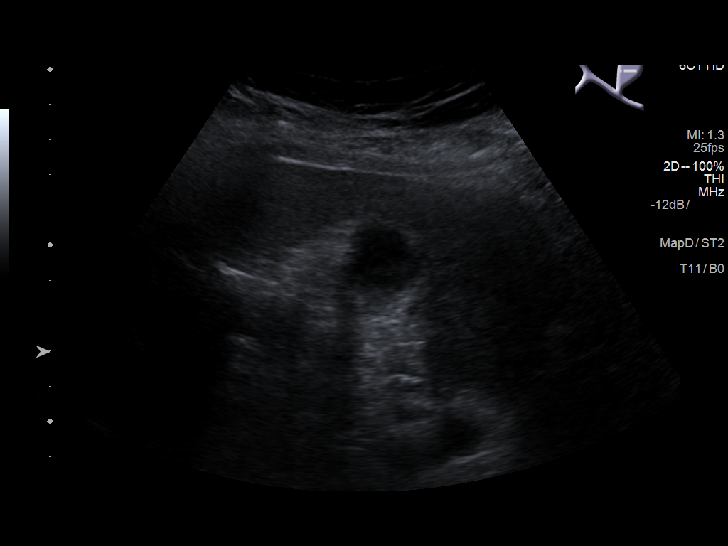
[im 17/38]
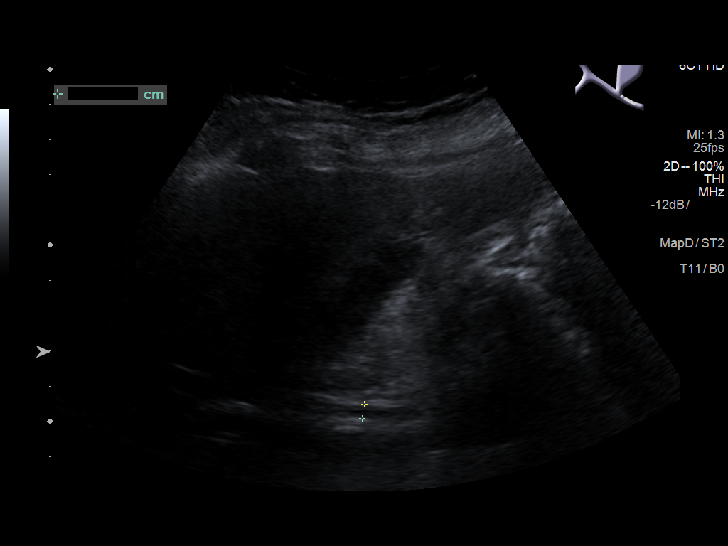
[im 21/38]
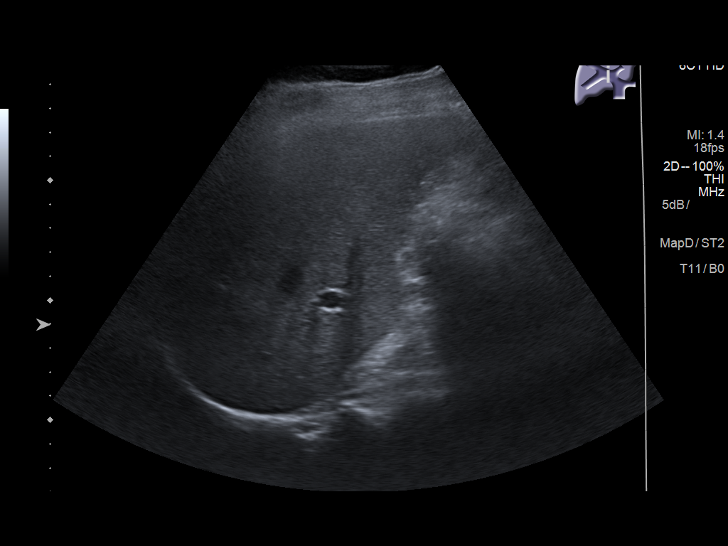
[im 24/38]
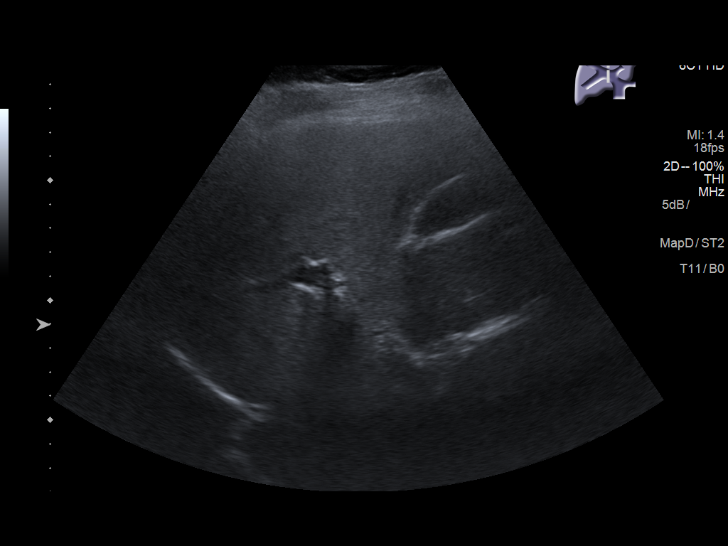
[im 25/38]
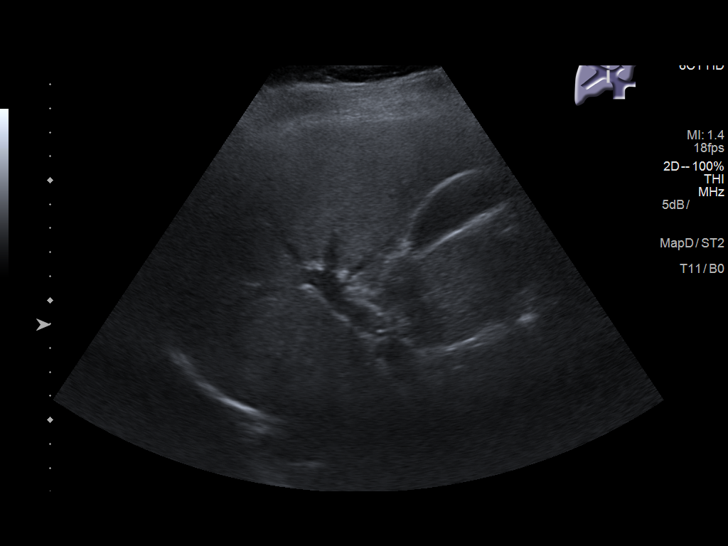
[im 28/38]
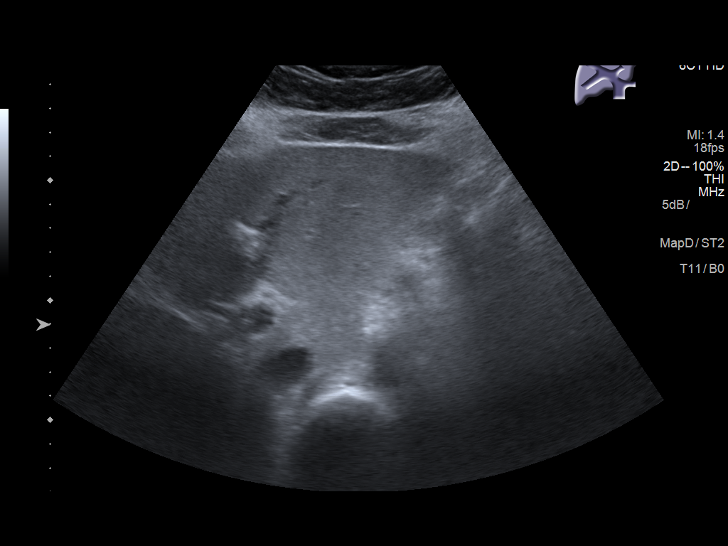
[im 31/38]
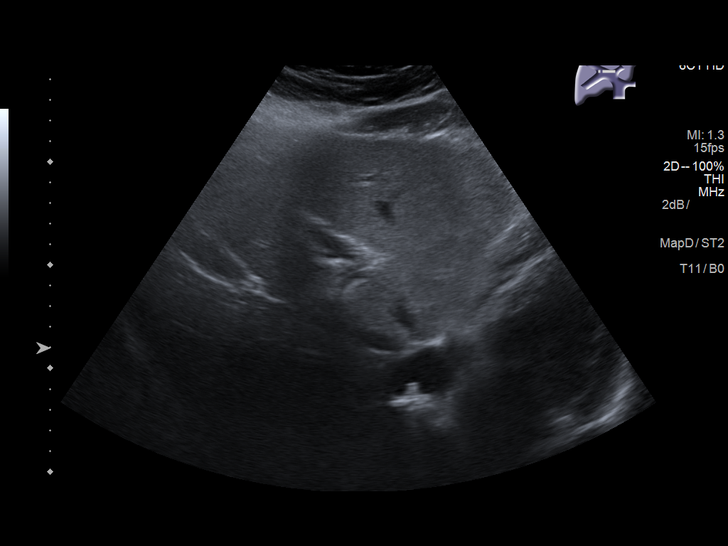
[im 34/38]
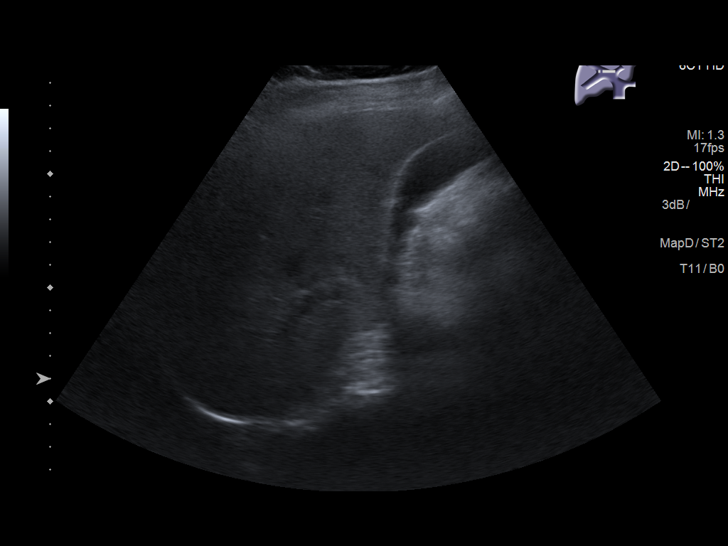
[im 38/38]
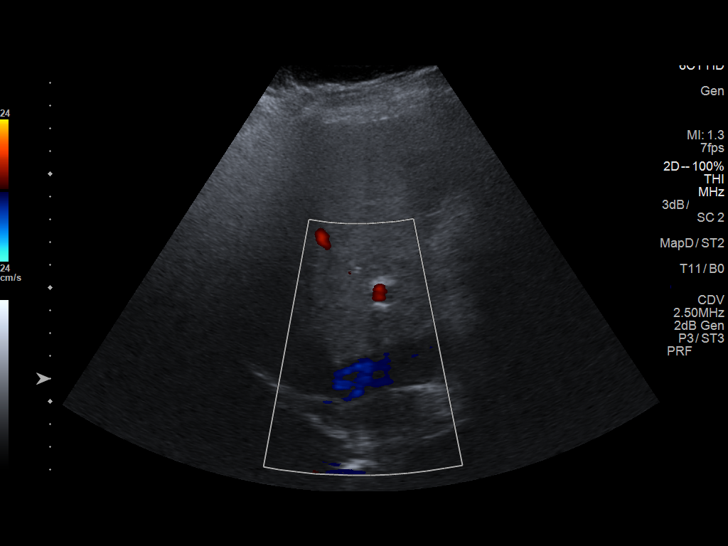

[14 of 25 positions shown; findings below may reference images not displayed]

FINDINGS: Gallbladder:

No gallstones or wall thickening visualized. No sonographic Murphy
sign noted by sonographer.

Common bile duct:

Diameter: 4 mm

Liver:

No cirrhotic features of the liver with no nodularity or segmental
enlargement. No lesions identified. Mildly heterogeneous
echotexture. Portal vein is patent on color Doppler imaging with
normal direction of blood flow towards the liver.
IMPRESSION: No cholelithiasis.

No cirrhotic features of the liver.

## 2020-05-19 ENCOUNTER — Other Ambulatory Visit: Payer: Self-pay | Admitting: Internal Medicine

## 2020-05-19 DIAGNOSIS — I10 Essential (primary) hypertension: Secondary | ICD-10-CM

## 2020-05-20 ENCOUNTER — Other Ambulatory Visit: Payer: Self-pay | Admitting: Physician Assistant

## 2020-05-20 MED ORDER — LISINOPRIL 20 MG PO TABS
20.0000 mg | ORAL_TABLET | Freq: Every day | ORAL | 3 refills | Status: DC
Start: 2020-05-20 — End: 2021-06-01

## 2020-05-21 ENCOUNTER — Ambulatory Visit: Payer: Self-pay

## 2020-05-21 ENCOUNTER — Other Ambulatory Visit: Payer: Self-pay

## 2020-05-21 DIAGNOSIS — Z0000 Encounter for general adult medical examination without abnormal findings: Secondary | ICD-10-CM

## 2020-05-21 LAB — POCT URINALYSIS DIPSTICK
Bilirubin, UA: NEGATIVE
Glucose, UA: POSITIVE — AB
Ketones, UA: NEGATIVE
Leukocytes, UA: NEGATIVE
Nitrite, UA: NEGATIVE
Protein, UA: NEGATIVE
Spec Grav, UA: 1.025 (ref 1.010–1.025)
Urobilinogen, UA: 0.2 E.U./dL
pH, UA: 6 (ref 5.0–8.0)

## 2020-05-21 NOTE — Progress Notes (Signed)
Recheck UA at next physical

## 2020-05-22 LAB — CMP12+LP+TP+TSH+6AC+PSA+CBC…
ALT: 41 IU/L (ref 0–44)
AST: 29 IU/L (ref 0–40)
Albumin/Globulin Ratio: 1.5 (ref 1.2–2.2)
Albumin: 4.3 g/dL (ref 3.8–4.9)
Alkaline Phosphatase: 68 IU/L (ref 48–121)
BUN/Creatinine Ratio: 20 (ref 9–20)
BUN: 19 mg/dL (ref 6–24)
Basophils Absolute: 0 10*3/uL (ref 0.0–0.2)
Basos: 0 %
Bilirubin Total: 0.9 mg/dL (ref 0.0–1.2)
Calcium: 9.3 mg/dL (ref 8.7–10.2)
Chloride: 100 mmol/L (ref 96–106)
Chol/HDL Ratio: 2.5 ratio (ref 0.0–5.0)
Cholesterol, Total: 73 mg/dL — ABNORMAL LOW (ref 100–199)
Creatinine, Ser: 0.96 mg/dL (ref 0.76–1.27)
EOS (ABSOLUTE): 0.2 10*3/uL (ref 0.0–0.4)
Eos: 2 %
Estimated CHD Risk: <0.5 times avg. (ref 0.0–1.0)
Free Thyroxine Index: 2.3 (ref 1.2–4.9)
GFR calc Af Amer: 101 mL/min/{1.73_m2} (ref >59)
GFR calc non Af Amer: 87 mL/min/{1.73_m2} (ref >59)
GGT: 48 IU/L (ref 0–65)
Globulin, Total: 2.9 g/dL (ref 1.5–4.5)
Glucose: 168 mg/dL — ABNORMAL HIGH (ref 65–99)
HDL: 29 mg/dL — ABNORMAL LOW (ref >39)
Hematocrit: 45 % (ref 37.5–51.0)
Hemoglobin: 14.7 g/dL (ref 13.0–17.7)
Immature Grans (Abs): 0 10*3/uL (ref 0.0–0.1)
Immature Granulocytes: 0 %
Iron: 78 ug/dL (ref 38–169)
LDH: 209 IU/L (ref 121–224)
LDL Chol Calc (NIH): 29 mg/dL (ref 0–99)
Lymphocytes Absolute: 2.7 10*3/uL (ref 0.7–3.1)
Lymphs: 37 %
MCH: 26.7 pg (ref 26.6–33.0)
MCHC: 32.7 g/dL (ref 31.5–35.7)
MCV: 82 fL (ref 79–97)
Monocytes Absolute: 0.7 10*3/uL (ref 0.1–0.9)
Monocytes: 9 %
Neutrophils Absolute: 3.8 10*3/uL (ref 1.4–7.0)
Neutrophils: 52 %
Phosphorus: 3.1 mg/dL (ref 2.8–4.1)
Platelets: 120 10*3/uL — ABNORMAL LOW (ref 150–450)
Potassium: 4.7 mmol/L (ref 3.5–5.2)
Prostate Specific Ag, Serum: 0.8 ng/mL (ref 0.0–4.0)
RBC: 5.5 x10E6/uL (ref 4.14–5.80)
RDW: 14.4 % (ref 11.6–15.4)
Sodium: 135 mmol/L (ref 134–144)
T3 Uptake Ratio: 29 % (ref 24–39)
T4, Total: 7.8 ug/dL (ref 4.5–12.0)
TSH: 1.37 u[IU]/mL (ref 0.450–4.500)
Total Protein: 7.2 g/dL (ref 6.0–8.5)
Triglycerides: 68 mg/dL (ref 0–149)
Uric Acid: 6.1 mg/dL (ref 3.8–8.4)
VLDL Cholesterol Cal: 15 mg/dL (ref 5–40)
WBC: 7.4 10*3/uL (ref 3.4–10.8)

## 2020-05-26 ENCOUNTER — Other Ambulatory Visit: Payer: Self-pay

## 2020-05-26 ENCOUNTER — Ambulatory Visit: Payer: Self-pay | Admitting: Physician Assistant

## 2020-05-26 ENCOUNTER — Encounter: Payer: Self-pay | Admitting: Physician Assistant

## 2020-05-26 VITALS — BP 144/80 | HR 75 | Temp 98.1°F | Resp 14 | Ht 67.0 in | Wt 206.0 lb

## 2020-05-26 DIAGNOSIS — Z0000 Encounter for general adult medical examination without abnormal findings: Secondary | ICD-10-CM

## 2020-05-26 DIAGNOSIS — E118 Type 2 diabetes mellitus with unspecified complications: Secondary | ICD-10-CM

## 2020-05-26 LAB — POCT GLYCOSYLATED HEMOGLOBIN (HGB A1C): Hemoglobin A1C: 6.7 % — AB (ref 4.0–5.6)

## 2020-05-26 NOTE — Progress Notes (Signed)
   Subjective: Annual physical exam    Patient ID: Frank Miles, male    DOB: 1960-12-23, 59 y.o.   MRN: [id number]  HPI Patient presents annual physical exam.  Patient voices no concerns or complaints.   Review of Systems    Diabetes, hyperlipidemia, and hypertension. Objective:   Physical Exam No acute distress.  BMI is 32.26.  HEENT is unremarkable.  Neck is supple for adenopathy or bruits.  Lungs are clear to auscultation.  Heart regular rate and rhythm.  EKG will be read by heart station Dr.  Patton Salles extremity shows a flexion deformity to the fifth digit right hand at the distal phalangeal joint.  No obvious deformity lower extremities.  Patient has free and equal range of motion of the upper and lower extremities.  No obvious cervical or lumbar spine deformity.  Patient has full and equal range of motion of the cervical lumbar spine.  Cranial nerves II through XII are grossly intact.     Assessment & Plan: Well exam.  Discussed patient lab results which show elevation glucose of 168.  Hemoglobin A1c was 6.7.  Advised to continue previous medication and follow-up as needed.

## 2020-06-09 ENCOUNTER — Other Ambulatory Visit: Payer: Self-pay | Admitting: Internal Medicine

## 2020-06-09 DIAGNOSIS — E118 Type 2 diabetes mellitus with unspecified complications: Secondary | ICD-10-CM

## 2020-06-12 ENCOUNTER — Other Ambulatory Visit: Payer: Self-pay

## 2020-06-12 ENCOUNTER — Ambulatory Visit: Payer: Self-pay

## 2020-06-12 DIAGNOSIS — S93402A Sprain of unspecified ligament of left ankle, initial encounter: Secondary | ICD-10-CM

## 2020-06-12 NOTE — Progress Notes (Signed)
Presents stating "rolled my ankle yesterday while weed eating & today I've rolled it 2 or 3 times".  Left ankle slightly swollen & C/O slight discomfort from being on feet all morning.  Left ankle wrap applied. Mediproxen given - advised to take two/day. Doesn't want ibuprofen (causes constipation). Advised to apply ice Offered to schedule appt for Monday or Tuesday with Dr. Mayford Knife - declined. States he wants to wait till Monday & see how it feels. Encouraged to complete [facility] incident report to have this documented.  Verbalized understanding.  AMD

## 2020-06-25 ENCOUNTER — Other Ambulatory Visit: Payer: Self-pay | Admitting: Internal Medicine

## 2020-06-25 DIAGNOSIS — E118 Type 2 diabetes mellitus with unspecified complications: Secondary | ICD-10-CM

## 2021-01-05 ENCOUNTER — Other Ambulatory Visit: Payer: Self-pay

## 2021-01-05 MED ORDER — ATORVASTATIN CALCIUM 80 MG PO TABS: 80.0000 mg | ORAL_TABLET | Freq: Every day | ORAL | 2 refills | Status: DC

## 2021-05-13 NOTE — Progress Notes (Signed)
Scheduled to complete physical 05/20/21.  AMD

## 2021-05-14 ENCOUNTER — Ambulatory Visit: Payer: Self-pay

## 2021-05-14 ENCOUNTER — Other Ambulatory Visit: Payer: Self-pay

## 2021-05-14 DIAGNOSIS — Z0000 Encounter for general adult medical examination without abnormal findings: Secondary | ICD-10-CM

## 2021-05-14 LAB — POCT URINALYSIS DIPSTICK
Bilirubin, UA: NEGATIVE
Blood, UA: POSITIVE
Glucose, UA: NEGATIVE
Ketones, UA: NEGATIVE
Leukocytes, UA: NEGATIVE
Nitrite, UA: NEGATIVE
Protein, UA: POSITIVE — AB
Spec Grav, UA: >=1.03 — AB (ref 1.010–1.025)
Urobilinogen, UA: 0.2 E.U./dL
pH, UA: 5 (ref 5.0–8.0)

## 2021-05-15 LAB — CMP12+LP+TP+TSH+6AC+PSA+CBC…
ALT: 39 IU/L (ref 0–44)
AST: 31 IU/L (ref 0–40)
Albumin/Globulin Ratio: 1.6 (ref 1.2–2.2)
Albumin: 4.5 g/dL (ref 3.8–4.9)
Alkaline Phosphatase: 76 IU/L (ref 44–121)
BUN/Creatinine Ratio: 18 (ref 9–20)
BUN: 17 mg/dL (ref 6–24)
Basophils Absolute: 0 10*3/uL (ref 0.0–0.2)
Basos: 0 %
Bilirubin Total: 1 mg/dL (ref 0.0–1.2)
Calcium: 9.4 mg/dL (ref 8.7–10.2)
Chloride: 101 mmol/L (ref 96–106)
Chol/HDL Ratio: 2.3 ratio (ref 0.0–5.0)
Cholesterol, Total: 85 mg/dL — ABNORMAL LOW (ref 100–199)
Creatinine, Ser: 0.93 mg/dL (ref 0.76–1.27)
EOS (ABSOLUTE): 0.1 10*3/uL (ref 0.0–0.4)
Eos: 2 %
Estimated CHD Risk: <0.5 times avg. (ref 0.0–1.0)
Free Thyroxine Index: 2.3 (ref 1.2–4.9)
GGT: 42 IU/L (ref 0–65)
Globulin, Total: 2.8 g/dL (ref 1.5–4.5)
Glucose: 158 mg/dL — ABNORMAL HIGH (ref 65–99)
HDL: 37 mg/dL — ABNORMAL LOW (ref >39)
Hematocrit: 46.7 % (ref 37.5–51.0)
Hemoglobin: 15.7 g/dL (ref 13.0–17.7)
Immature Grans (Abs): 0 10*3/uL (ref 0.0–0.1)
Immature Granulocytes: 0 %
Iron: 84 ug/dL (ref 38–169)
LDH: 212 IU/L (ref 121–224)
LDL Chol Calc (NIH): 35 mg/dL (ref 0–99)
Lymphocytes Absolute: 3.4 10*3/uL — ABNORMAL HIGH (ref 0.7–3.1)
Lymphs: 42 %
MCH: 27.1 pg (ref 26.6–33.0)
MCHC: 33.6 g/dL (ref 31.5–35.7)
MCV: 81 fL (ref 79–97)
Monocytes Absolute: 0.6 10*3/uL (ref 0.1–0.9)
Monocytes: 8 %
Neutrophils Absolute: 3.8 10*3/uL (ref 1.4–7.0)
Neutrophils: 48 %
Phosphorus: 3.2 mg/dL (ref 2.8–4.1)
Platelets: 152 10*3/uL (ref 150–450)
Potassium: 4.5 mmol/L (ref 3.5–5.2)
Prostate Specific Ag, Serum: 0.9 ng/mL (ref 0.0–4.0)
RBC: 5.79 x10E6/uL (ref 4.14–5.80)
RDW: 14.4 % (ref 11.6–15.4)
Sodium: 139 mmol/L (ref 134–144)
T3 Uptake Ratio: 25 % (ref 24–39)
T4, Total: 9 ug/dL (ref 4.5–12.0)
TSH: 1.53 u[IU]/mL (ref 0.450–4.500)
Total Protein: 7.3 g/dL (ref 6.0–8.5)
Triglycerides: 51 mg/dL (ref 0–149)
Uric Acid: 6.4 mg/dL (ref 3.8–8.4)
VLDL Cholesterol Cal: 13 mg/dL (ref 5–40)
WBC: 8.1 10*3/uL (ref 3.4–10.8)
eGFR: 95 mL/min/{1.73_m2} (ref >59)

## 2021-05-15 LAB — MICROALBUMIN / CREATININE URINE RATIO
Creatinine, Urine: 129.5 mg/dL
Microalb/Creat Ratio: 33 mg/g creat — ABNORMAL HIGH (ref 0–29)
Microalbumin, Urine: 42.5 ug/mL

## 2021-05-15 LAB — HGB A1C W/O EAG: Hgb A1c MFr Bld: 8.9 % — ABNORMAL HIGH (ref 4.8–5.6)

## 2021-05-20 ENCOUNTER — Ambulatory Visit: Payer: Self-pay | Admitting: Physician Assistant

## 2021-05-20 ENCOUNTER — Other Ambulatory Visit: Payer: Self-pay

## 2021-05-20 ENCOUNTER — Encounter: Payer: Self-pay | Admitting: Physician Assistant

## 2021-05-20 VITALS — Temp 97.6°F | Resp 14 | Ht 68.0 in | Wt 205.0 lb

## 2021-05-20 DIAGNOSIS — Z0000 Encounter for general adult medical examination without abnormal findings: Secondary | ICD-10-CM

## 2021-05-20 NOTE — Progress Notes (Signed)
Reviewed CDC recommendations for importance of HIV/ Hep C screening once in lifetime. Patient has declined HIV / Hep C screenings today and will let us know if they should change their mind in the future.   Pt due for diabetic foot exam and eye exam as well. CL,RMA

## 2021-05-20 NOTE — Progress Notes (Signed)
   Subjective: Annual physical exam    Patient ID: Frank Miles, male    DOB: September 20, 1961, 60 y.o.   MRN: [id number]  HPI Patient presents for annual physical exam.  Patient with no concerning complaints.  Patient is currently upset secondary to being notified of loss of his brother earlier this morning.   Review of Systems Diabetes, hyperlipidemia, hypertension.    Objective:   Physical Exam No acute distress.  BP is 156/96, pulse 76, patient 96% O2 sat on room air. HEENT is unremarkable.  Neck is supple without adenopathy or bruits.  Lungs clear to auscultation.  Heart regular rate and rhythm.  EKG shows left atrial enlargement.  No change from previous EKG last year. Abdomen distended secondary to body habitus, normoactive bowel sounds, soft, nontender to palpation. No obvious deformity to the upper or lower extremities.  Patient has full and equal range of motion upper lower extremities. No obvious cervical or lumbar spine deformity.  Patient had full and equal range of motion of the cervical lumbar spine. Cranial nerves II through XII grossly intact.  DTRs are 2+ without clonus.  Diabetic foot exam will follow abnormal findings.     Assessment & Plan: Well exam.   Discussed lab results with patient.  Hemoglobin A1c has increased from 6.7 last year to 8.9.  Patient admits to noncompliance of diet and exercise.  Patient states taking medication as directed consistent on metformin at 1000 twice daily and good control 10 mg daily.  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  Patient requests 10-month trial of compliance and follow-up.

## 2021-06-01 ENCOUNTER — Other Ambulatory Visit: Payer: Self-pay

## 2021-06-01 ENCOUNTER — Other Ambulatory Visit: Payer: Self-pay | Admitting: Physician Assistant

## 2021-06-01 DIAGNOSIS — I10 Essential (primary) hypertension: Secondary | ICD-10-CM

## 2021-07-02 ENCOUNTER — Other Ambulatory Visit: Payer: Self-pay

## 2021-07-02 ENCOUNTER — Other Ambulatory Visit: Payer: Self-pay | Admitting: Emergency Medicine

## 2021-07-02 DIAGNOSIS — E118 Type 2 diabetes mellitus with unspecified complications: Secondary | ICD-10-CM

## 2021-07-26 ENCOUNTER — Other Ambulatory Visit: Payer: Self-pay

## 2021-07-26 DIAGNOSIS — E7849 Other hyperlipidemia: Secondary | ICD-10-CM

## 2021-07-26 MED ORDER — ATORVASTATIN CALCIUM 80 MG PO TABS: 80.0000 mg | ORAL_TABLET | Freq: Every day | ORAL | 3 refills | Status: DC

## 2021-08-09 ENCOUNTER — Ambulatory Visit: Payer: Self-pay | Admitting: Physician Assistant

## 2021-08-09 ENCOUNTER — Encounter: Payer: Self-pay | Admitting: Physician Assistant

## 2021-08-09 ENCOUNTER — Other Ambulatory Visit: Payer: Self-pay

## 2021-08-09 ENCOUNTER — Ambulatory Visit
Admission: RE | Admit: 2021-08-09 | Discharge: 2021-08-09 | Disposition: A | Discharge status: Home / Self Care | Payer: 59 | Source: Ambulatory Visit | Attending: Physician Assistant | Admitting: Physician Assistant

## 2021-08-09 VITALS — BP 155/85 | HR 72 | Temp 97.6°F | Resp 12 | Ht 68.0 in | Wt 204.0 lb

## 2021-08-09 DIAGNOSIS — M545 Low back pain, unspecified: Secondary | ICD-10-CM | POA: Insufficient documentation

## 2021-08-09 MED ORDER — NAPROXEN 500 MG PO TABS: 500.0000 mg | ORAL_TABLET | Freq: Two times a day (BID) | ORAL | 0 refills | Status: DC

## 2021-08-09 MED ORDER — ORPHENADRINE CITRATE ER 100 MG PO TB12: 100.0000 mg | ORAL_TABLET | Freq: Two times a day (BID) | ORAL | 0 refills | Status: DC

## 2021-08-09 NOTE — Progress Notes (Signed)
   Subjective: Acute low back pain    Patient ID: Frank Miles, male    DOB: Dec 05, 1960, 60 y.o.   MRN: [id number]  HPI Patient presents with 10 days of low back pain.  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  Patient denies radicular component to back pain.  Patient denies bladder or bowel dysfunction.  Patient was concerned secondary to early arthritic changes noticed in the lumbar spine 2 to 3 years ago.  Patient rates the pain as a 5/10.  Patient described pain as "achy".  No palliative measure for complaint.   Review of Systems Diabetes, GERD, and hypertension.    Objective:   Physical Exam No acute distress.  Temperature 97.6, pulse 72, respiration 12, BP is 155/85 patient is 94% O2 sat on room air.  Patient weighs 204 pounds and BMI is 31.02. No obvious deformity to lumbar spine.  Patient sits and stands without have reliance on upper extremities.  Patient is moderate guarding palpation of L4-S1.  Patient has decreased range of motion with flexion and extension.  Patient lateral movements are full and equal.  There is bilateral paraspinal muscle spasm with lateral movements.  Patient had negative straight leg test in supine position.       Assessment & Plan: Low back pain.   Given a prescription for Norflex and naproxen.  Patient recent outpatient imaging for x-ray of the lumbar spine.  Patient will follow-up in 3 days.

## 2021-08-09 NOTE — Progress Notes (Signed)
3 different episodes of pain. From both sides of the neck, to the back of neck and now its in his lower back./CL,RMA

## 2021-08-12 ENCOUNTER — Ambulatory Visit: Payer: Self-pay | Admitting: Physician Assistant

## 2021-08-12 ENCOUNTER — Encounter: Payer: Self-pay | Admitting: Physician Assistant

## 2021-08-12 ENCOUNTER — Other Ambulatory Visit: Payer: Self-pay

## 2021-08-12 VITALS — BP 160/80 | HR 72 | Temp 97.7°F | Resp 16 | Ht 68.0 in | Wt 211.0 lb

## 2021-08-12 DIAGNOSIS — M549 Dorsalgia, unspecified: Secondary | ICD-10-CM

## 2021-08-12 DIAGNOSIS — G8929 Other chronic pain: Secondary | ICD-10-CM

## 2021-08-12 MED ORDER — TRAMADOL HCL 50 MG PO TABS: 50.0000 mg | ORAL_TABLET | Freq: Three times a day (TID) | ORAL | 0 refills | Status: AC | PRN

## 2021-08-12 MED ORDER — MELOXICAM 15 MG PO TABS
15.0000 mg | ORAL_TABLET | Freq: Every day | ORAL | 0 refills | Status: DC
Start: 2021-08-12 — End: 2022-08-10

## 2021-08-12 NOTE — Addendum Note (Signed)
Addended by: Gardner Candle on: 08/12/2021 10:57 AM   Modules accepted: Orders

## 2021-08-12 NOTE — Progress Notes (Signed)
   Subjective: Back pain    Patient ID: Frank Miles, male    DOB: 1961/07/25, 60 y.o.   MRN: [id number]  HPI Patient presents for reevaluation of low back pain.  Patient said chronic back pain has increased in the past 10 days.  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  Patient denies any specific provocative incident for his pain.  Patient states pain is worse in the morning and improves mid afternoon.  Patient is taking anti-inflammatory medications with no noticeable relief.  Denies radicular component to his pain.  Denies bladder or bowel dysfunction.   Review of Systems Diabetes, GERD, hyperlipidemia, hypertension.    Objective:   Physical Exam No acute distress.  Examination of the back reveals mild left scoliosis.  Patient has moderate guarding palpation L4-S1.  Patient has decreased range of motion with flexion and right lateral movements.  Patient had negative straight leg test supine position.  X-ray shows diffuse multilevel degenerative change in the lumbar spine.       Assessment & Plan:   Discussed sequela of arthritic back pain.  Patient elected to be evaluated by orthopedics for treatment options.  Patient given a prescription for meloxicam to take on a daily basis pending orthopedic appointment.  Patient given a prescription for tramadol for breakthrough pain.  Advised on drowsy effect of tramadol.  Advised to not take this medication when working or driving.

## 2021-08-12 NOTE — Addendum Note (Signed)
Addended by: Gardner Candle on: 08/12/2021 11:13 AM   Modules accepted: Orders

## 2021-08-12 NOTE — Progress Notes (Signed)
States Naproxen & Norflex are not helping with pain. States he's also been taking tylenol & ibuprofen.  AMD

## 2021-08-13 DIAGNOSIS — M5416 Radiculopathy, lumbar region: Secondary | ICD-10-CM | POA: Insufficient documentation

## 2021-08-13 DIAGNOSIS — M545 Low back pain, unspecified: Secondary | ICD-10-CM | POA: Diagnosis not present

## 2021-08-23 ENCOUNTER — Ambulatory Visit: Payer: 59 | Admitting: Physician Assistant

## 2021-08-25 ENCOUNTER — Ambulatory Visit: Payer: Self-pay | Admitting: Physician Assistant

## 2021-08-25 ENCOUNTER — Other Ambulatory Visit: Payer: Self-pay

## 2021-08-25 ENCOUNTER — Other Ambulatory Visit: Payer: Self-pay | Admitting: Physician Assistant

## 2021-08-25 ENCOUNTER — Encounter: Payer: Self-pay | Admitting: Physician Assistant

## 2021-08-25 VITALS — BP 153/97 | HR 81 | Temp 97.1°F | Resp 14 | Ht 67.5 in | Wt 206.0 lb

## 2021-08-25 DIAGNOSIS — E118 Type 2 diabetes mellitus with unspecified complications: Secondary | ICD-10-CM

## 2021-08-25 LAB — POCT GLYCOSYLATED HEMOGLOBIN (HGB A1C): Hemoglobin A1C: 7.5 % — AB (ref 4.0–5.6)

## 2021-08-25 MED ORDER — LISINOPRIL 40 MG PO TABS: 40.0000 mg | ORAL_TABLET | Freq: Every day | ORAL | 3 refills | Status: DC

## 2021-08-25 NOTE — Progress Notes (Signed)
   Subjective: Diabetes    Patient ID: Frank Miles, male    DOB: February 16, 1961, 60 y.o.   MRN: [id number]  HPI Patient here today to follow-up of diabetes.  3 months ago patient admits to noncompliance to medication and promised to restart and take medication as directed.  Patient is restarted metformin at 1000 mg twice daily and glipizide at 10 mg daily.   Review of Systems Diabetes, hyperlipidemia, hypertension.    Objective:   Physical Exam No acute distress.  Temperature 97.1, pulse 81, respiration 14, BP is 153/97, patient 97% O2 sat on room air.  Patient weighs 206 pounds and BMI is 31.79. Hemoglobin A1c has decreased from 8.9 to 7.5.         Assessment & Plan: Diabetes.   Patient is showing marked improvement in glucose control.  Advised continue medication.  Discussed blood pressure readings which not optimal for his medical condition.  We will follow-up in 3 months and reassess hypertension at the same time.

## 2021-08-25 NOTE — Progress Notes (Signed)
Recheck Left Arm = 166/93 Right Arm = 146/95  A1c Now = 7.5   AMD

## 2021-11-17 ENCOUNTER — Other Ambulatory Visit: Payer: Self-pay

## 2021-11-17 ENCOUNTER — Other Ambulatory Visit: Payer: 59

## 2021-11-17 DIAGNOSIS — E118 Type 2 diabetes mellitus with unspecified complications: Secondary | ICD-10-CM

## 2021-11-17 NOTE — Progress Notes (Signed)
Pt presents today for 3 month a1c check.

## 2021-11-18 LAB — HGB A1C W/O EAG: Hgb A1c MFr Bld: 7.9 % — ABNORMAL HIGH (ref 4.8–5.6)

## 2021-11-24 ENCOUNTER — Ambulatory Visit: Payer: Self-pay | Admitting: Physician Assistant

## 2021-11-24 ENCOUNTER — Other Ambulatory Visit: Payer: Self-pay

## 2021-11-24 ENCOUNTER — Encounter: Payer: Self-pay | Admitting: Physician Assistant

## 2021-11-24 VITALS — BP 164/101 | HR 95 | Temp 98.0°F | Resp 12 | Ht 68.0 in | Wt 207.0 lb

## 2021-11-24 DIAGNOSIS — E118 Type 2 diabetes mellitus with unspecified complications: Secondary | ICD-10-CM

## 2021-11-24 NOTE — Progress Notes (Signed)
° °  Subjective: Diabetes    Patient ID: Frank Miles, male    DOB: Apr 06, 1961, 61 y.o.   MRN: [id number]  HPI Patient here for 35-month follow-up for diabetes.  Patient has restarted his metformin and ankle control 3 months ago.  Patient admits that this was during the Thanksgiving and Christmas season he has not suppress his holiday eating.  Patient states starting this month he will start an exercise program and watch his diet.  Review of Systems Diabetes and hypertension.    Objective:   Physical Exam No acute distress.  Temperature 98, pulse 95, respiration 12, BP is 164/101.  Patient took lisinopril at 7:00 this morning.  Patient is 95% O2 sat on room air.  Patient weighs 207 pounds and BMI is 31.47.       Assessment & Plan: Diabetes.   Patient hemoglobin A1c increased from 7.5 to 7.9 in the last 3 months.  Patient advised on diet and exercise.  Patient will follow-up in 3 months for hemoglobin A1c check.

## 2021-11-24 NOTE — Progress Notes (Signed)
Pt presents today for 3 month follow up with lab results./CL,RMA  /Recheck BP with manual: 170/90

## 2021-11-30 ENCOUNTER — Other Ambulatory Visit: Payer: Self-pay

## 2021-11-30 ENCOUNTER — Ambulatory Visit: Payer: Self-pay

## 2021-11-30 VITALS — BP 175/99 | HR 66 | Resp 14

## 2021-11-30 DIAGNOSIS — Z0130 Encounter for examination of blood pressure without abnormal findings: Secondary | ICD-10-CM

## 2021-11-30 NOTE — Progress Notes (Signed)
Pt presents today for BP check. Pt stated he went to the gym twice today and ran 2 miles. Took BP medication at 7:30am today.

## 2022-07-01 IMAGING — CR DG LUMBAR SPINE COMPLETE 4+V
1 series · 5 of 5 positions shown · non-contrast
Comparison: MRI 05/15/2014.

CLINICAL DATA: Low back pain.  No known injury.

EXAM:
LUMBAR SPINE - COMPLETE 4+ VIEW

[Series 1: dg lumbar spine complete 4 +v · 0.14mm/px · 5 of 5 slices shown]
[im 1/5]
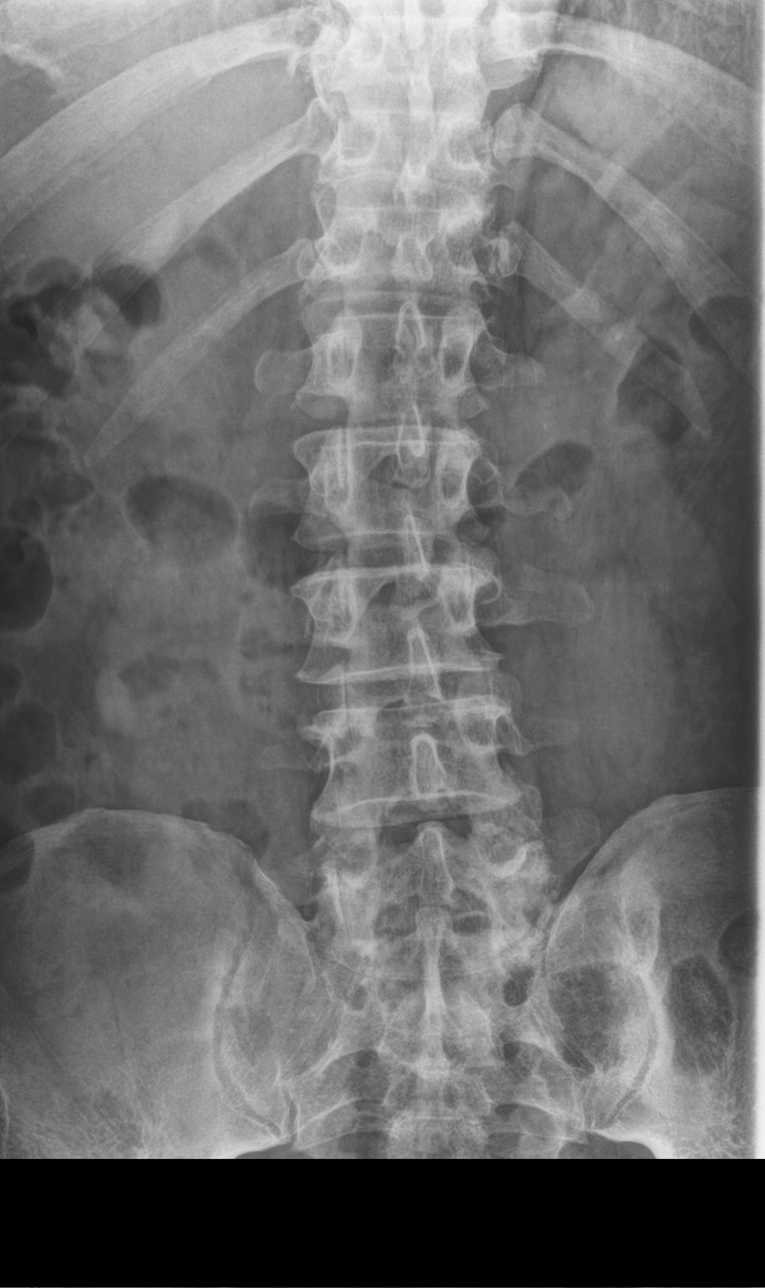
[im 2/5]
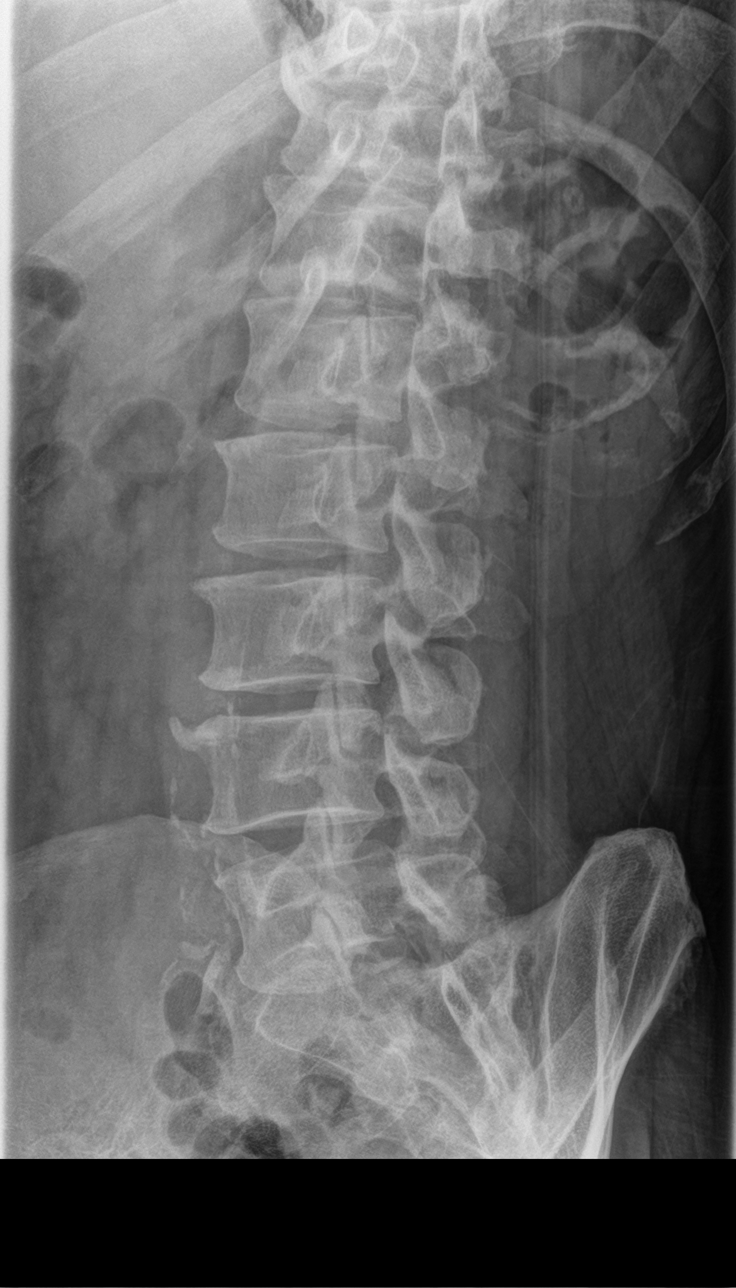
[im 3/5]
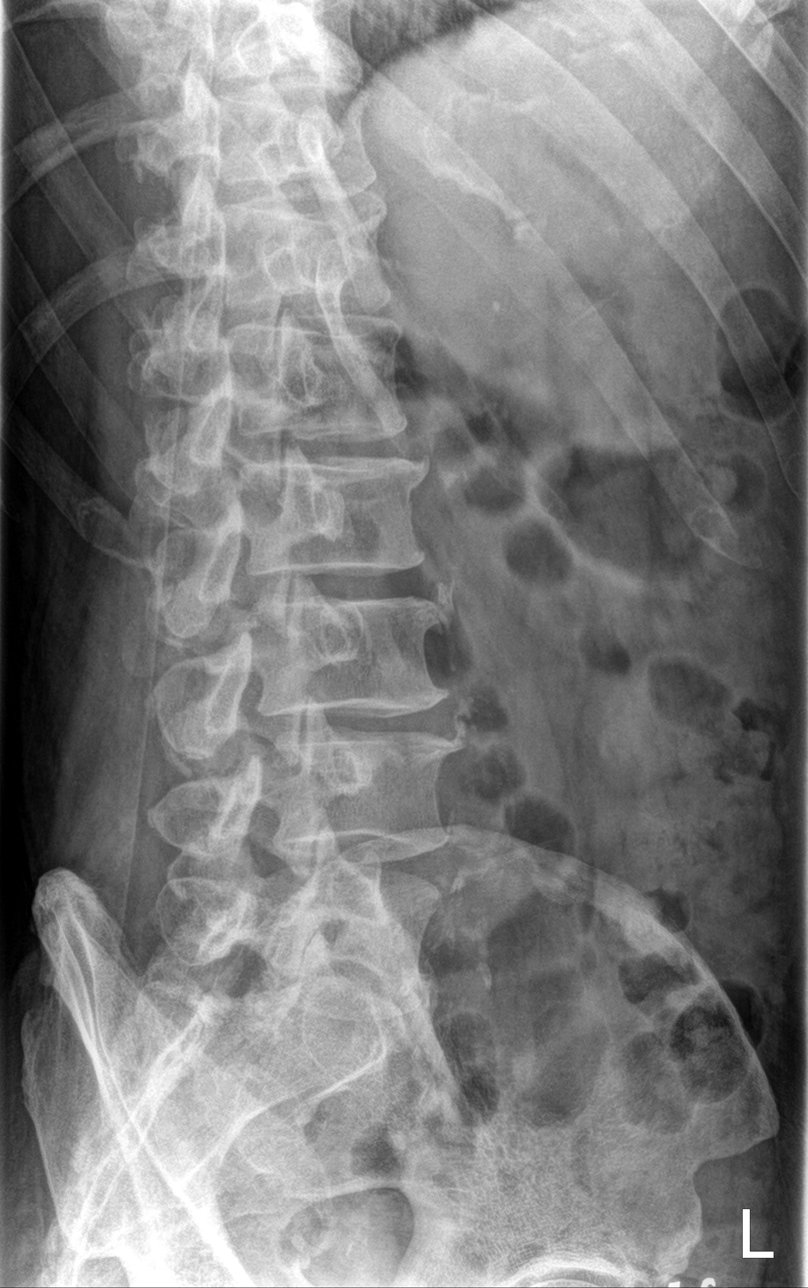
[im 4/5]
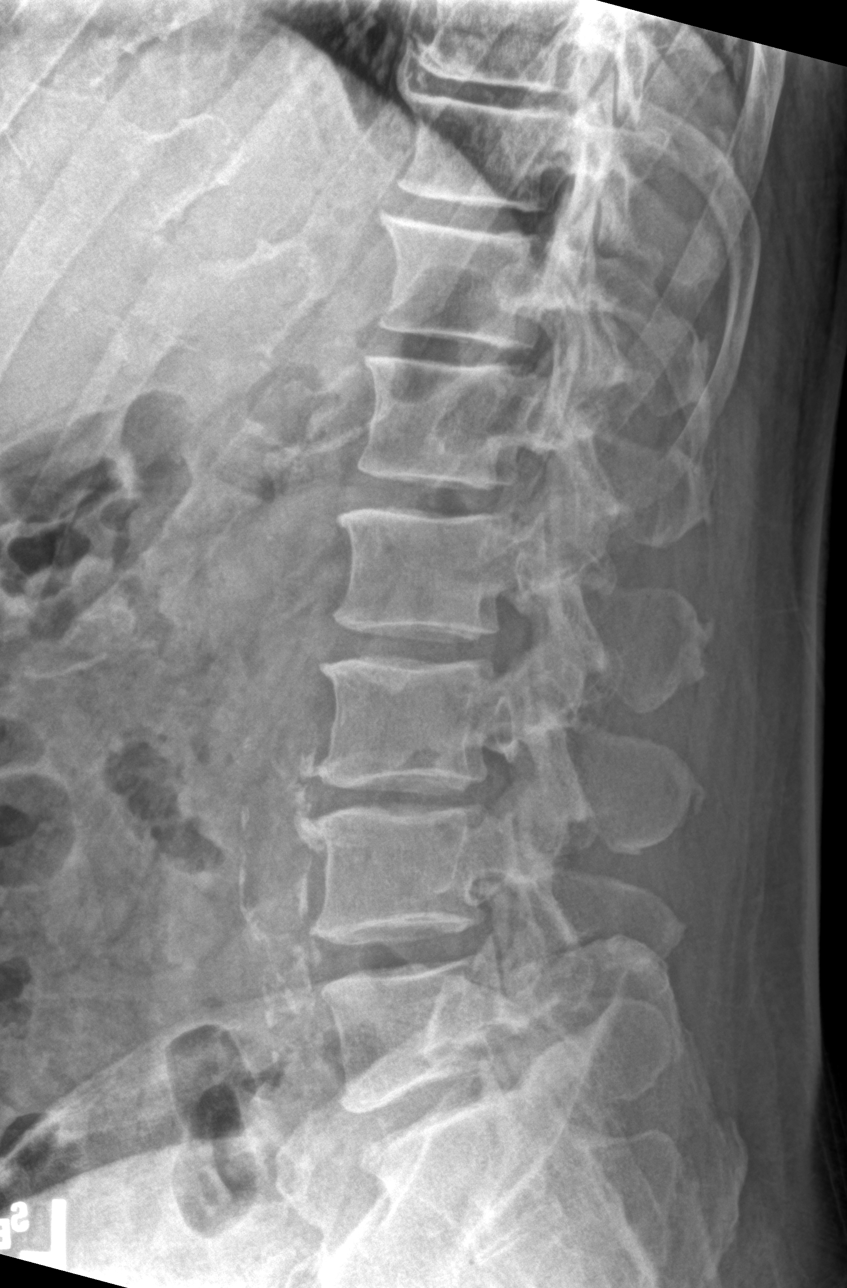
[im 5/5]
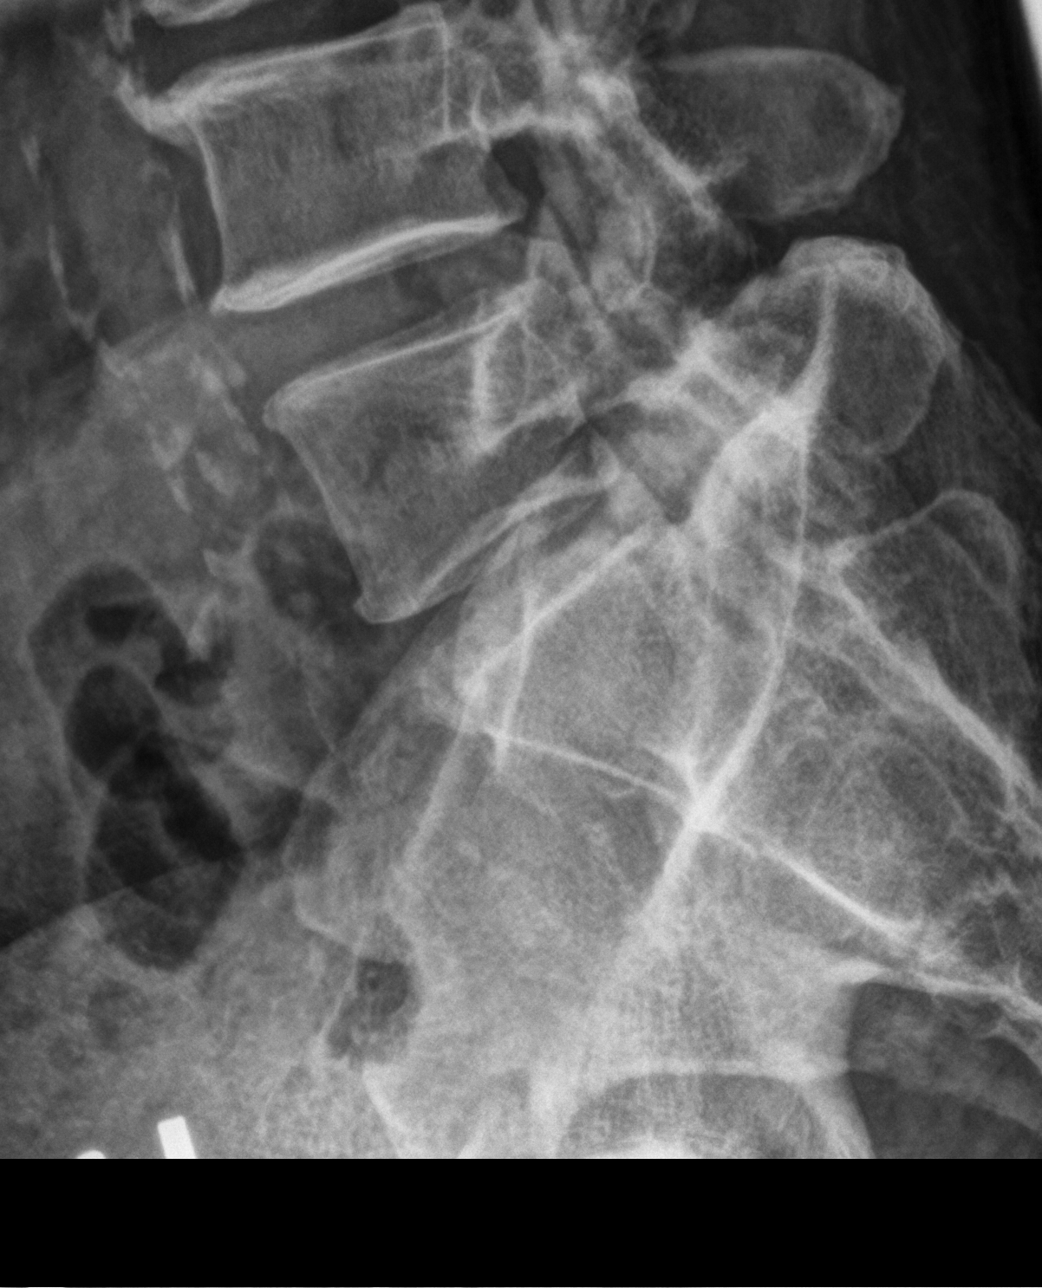

[5 of 5 positions shown; findings below may reference images not displayed]

FINDINGS: Mild lumbar scoliosis concave left. Diffuse multilevel degenerative
endplate osteophyte formation. No evidence of fracture. Aortoiliac
atherosclerotic vascular calcification.
IMPRESSION: 1. Mild lumbar spine scoliosis. Diffuse multilevel degenerative
change. No acute abnormality.

2.  Aortoiliac atherosclerotic vascular disease.

## 2022-08-10 ENCOUNTER — Encounter: Payer: Self-pay | Admitting: Physician Assistant

## 2022-08-10 ENCOUNTER — Ambulatory Visit: Payer: Self-pay | Admitting: Physician Assistant

## 2022-08-10 VITALS — BP 171/86 | HR 79 | Resp 14 | Ht 67.0 in | Wt 202.0 lb

## 2022-08-10 DIAGNOSIS — T148XXA Other injury of unspecified body region, initial encounter: Secondary | ICD-10-CM

## 2022-08-10 NOTE — Progress Notes (Signed)
   Subjective: Puncture wound right lower leg    Patient ID: Frank Miles, male    DOB: [birth year], 61 y.o.   MRN: [id number]  HPI Patient presents for puncture wound to the right lower leg.  Patient stated he was pressure washing and hit his leg on an unknown object.  Patient states there was copious bleeding was controlled with direct pressure.  Patient takes aspirin as a blood thinner.  Denies loss of function or sensation.  Patient able to continue working.  Tetanus is up-to-date.   Review of Systems Negative except for chief complaint    Objective:   Physical Exam No acute distress.  Ambulates with normal gait. BP 171/86, pulse 79, respiration 14, and patient 96% O2 sat on room air.  Patient patient weighs 292. Patient presents with a 0.3 cm laceration/puncture wound to the medial distal third of the right lower leg.  Bleeding is controlled.  Neurovascular intact free Nikkel range of motion.      Assessment & Plan: Puncture wound  Area was clean and Dermabond applied.  Patient given discharge care instruction advised return back if bleeding recurs before wound is healed.

## 2022-08-10 NOTE — Progress Notes (Signed)
Pt presents today with WC injury. DOI 08-08-22. Right Punctured shin bone. Pt not sure what item hit him.

## 2022-08-25 ENCOUNTER — Other Ambulatory Visit: Payer: Self-pay

## 2022-08-25 NOTE — Progress Notes (Signed)
Pending uds results with [facility]. Pt completed random DOT uds.

## 2023-03-10 ENCOUNTER — Ambulatory Visit: Payer: Self-pay

## 2023-03-10 DIAGNOSIS — Z0000 Encounter for general adult medical examination without abnormal findings: Secondary | ICD-10-CM

## 2023-03-10 LAB — POCT URINALYSIS DIPSTICK
Bilirubin, UA: NEGATIVE
Blood, UA: NEGATIVE
Glucose, UA: POSITIVE — AB
Ketones, UA: NEGATIVE
Leukocytes, UA: NEGATIVE
Nitrite, UA: NEGATIVE
Protein, UA: POSITIVE — AB
Spec Grav, UA: 1.015 (ref 1.010–1.025)
Urobilinogen, UA: 0.2 E.U./dL
pH, UA: 6 (ref 5.0–8.0)

## 2023-03-10 NOTE — Progress Notes (Signed)
  Pt completed lab portion of sceduled physical.   

## 2023-03-13 LAB — CMP12+LP+TP+TSH+6AC+PSA+CBC…
ALT: 35 IU/L (ref 0–44)
AST: 20 IU/L (ref 0–40)
Albumin/Globulin Ratio: 1.4 (ref 1.2–2.2)
Albumin: 4.4 g/dL (ref 3.8–4.9)
Alkaline Phosphatase: 92 IU/L (ref 44–121)
BUN/Creatinine Ratio: 13 (ref 10–24)
BUN: 11 mg/dL (ref 8–27)
Basophils Absolute: 0 10*3/uL (ref 0.0–0.2)
Basos: 0 %
Bilirubin Total: 0.8 mg/dL (ref 0.0–1.2)
Calcium: 9.9 mg/dL (ref 8.6–10.2)
Chloride: 93 mmol/L — ABNORMAL LOW (ref 96–106)
Chol/HDL Ratio: 4.8 ratio (ref 0.0–5.0)
Cholesterol, Total: 201 mg/dL — ABNORMAL HIGH (ref 100–199)
Creatinine, Ser: 0.84 mg/dL (ref 0.76–1.27)
EOS (ABSOLUTE): 0.1 10*3/uL (ref 0.0–0.4)
Eos: 2 %
Estimated CHD Risk: 1 times avg. (ref 0.0–1.0)
Free Thyroxine Index: 2.7 (ref 1.2–4.9)
GGT: 37 IU/L (ref 0–65)
Globulin, Total: 3.2 g/dL (ref 1.5–4.5)
Glucose: 336 mg/dL — ABNORMAL HIGH (ref 70–99)
HDL: 42 mg/dL (ref >39)
Hematocrit: 55.7 % — ABNORMAL HIGH (ref 37.5–51.0)
Hemoglobin: 18 g/dL — ABNORMAL HIGH (ref 13.0–17.7)
Immature Grans (Abs): 0 10*3/uL (ref 0.0–0.1)
Immature Granulocytes: 0 %
Iron: 68 ug/dL (ref 38–169)
LDH: 172 IU/L (ref 121–224)
LDL Chol Calc (NIH): 141 mg/dL — ABNORMAL HIGH (ref 0–99)
Lymphocytes Absolute: 2.5 10*3/uL (ref 0.7–3.1)
Lymphs: 42 %
MCH: 26.1 pg — ABNORMAL LOW (ref 26.6–33.0)
MCHC: 32.3 g/dL (ref 31.5–35.7)
MCV: 81 fL (ref 79–97)
Monocytes Absolute: 0.5 10*3/uL (ref 0.1–0.9)
Monocytes: 8 %
Neutrophils Absolute: 2.9 10*3/uL (ref 1.4–7.0)
Neutrophils: 48 %
Phosphorus: 3.3 mg/dL (ref 2.8–4.1)
Platelets: 165 10*3/uL (ref 150–450)
Potassium: 4.4 mmol/L (ref 3.5–5.2)
Prostate Specific Ag, Serum: 0.9 ng/mL (ref 0.0–4.0)
RBC: 6.9 x10E6/uL — ABNORMAL HIGH (ref 4.14–5.80)
RDW: 14.1 % (ref 11.6–15.4)
Sodium: 135 mmol/L (ref 134–144)
T3 Uptake Ratio: 28 % (ref 24–39)
T4, Total: 9.7 ug/dL (ref 4.5–12.0)
TSH: 2.02 u[IU]/mL (ref 0.450–4.500)
Total Protein: 7.6 g/dL (ref 6.0–8.5)
Triglycerides: 101 mg/dL (ref 0–149)
Uric Acid: 4.8 mg/dL (ref 3.8–8.4)
VLDL Cholesterol Cal: 18 mg/dL (ref 5–40)
WBC: 6.1 10*3/uL (ref 3.4–10.8)
eGFR: 100 mL/min/{1.73_m2} (ref >59)

## 2023-03-13 LAB — MICROALBUMIN / CREATININE URINE RATIO
Creatinine, Urine: 71.8 mg/dL
Microalb/Creat Ratio: 198 mg/g creat — ABNORMAL HIGH (ref 0–29)
Microalbumin, Urine: 142.1 ug/mL

## 2023-03-13 LAB — HGB A1C W/O EAG: Hgb A1c MFr Bld: 15 % — ABNORMAL HIGH (ref 4.8–5.6)

## 2023-03-14 ENCOUNTER — Ambulatory Visit: Payer: Self-pay | Admitting: Physician Assistant

## 2023-03-14 ENCOUNTER — Encounter: Payer: Self-pay | Admitting: Physician Assistant

## 2023-03-14 VITALS — BP 177/87 | HR 88 | Temp 97.7°F | Resp 14 | Ht 68.0 in | Wt 189.0 lb

## 2023-03-14 DIAGNOSIS — E118 Type 2 diabetes mellitus with unspecified complications: Secondary | ICD-10-CM

## 2023-03-14 DIAGNOSIS — Z0000 Encounter for general adult medical examination without abnormal findings: Secondary | ICD-10-CM

## 2023-03-14 DIAGNOSIS — I10 Essential (primary) hypertension: Secondary | ICD-10-CM

## 2023-03-14 NOTE — Addendum Note (Signed)
Addended by: Gardner Candle on: 03/14/2023 01:46 PM   Modules accepted: Orders

## 2023-03-14 NOTE — Progress Notes (Signed)
[facility]  ____________________________________________   None    (approximate)  I have reviewed the triage vital signs and the nursing notes.   HISTORY  Chief Complaint No chief complaint on file.   HPI Frank Miles is a 62 y.o. male patient presents for annual physical exam.  Patient did not have physical exam last year.  Patient admits to noncompliance of medication for diabetes.        Past Medical History:  Diagnosis Date   Anemia    CAD (coronary artery disease)    Diabetes mellitus without complication (HCC)    Elevated lipids    GERD (gastroesophageal reflux disease)    Hypertension     Patient Active Problem List   Diagnosis Date Noted   Lumbar radiculopathy 08/13/2021   Coronary artery disease involving coronary bypass graft of native heart without angina pectoris 06/20/2019   Class 1 obesity due to excess calories with serious comorbidity and body mass index (BMI) of 31.0 to 31.9 in adult 06/20/2019   Other hyperlipidemia 06/20/2019   Gastroesophageal reflux disease without esophagitis 06/20/2019   Increased liver enzymes 06/20/2019   History of iron deficiency anemia 06/20/2019   Arm pain, right 07/05/2016   Essential hypertension 03/09/2016   NSTEMI (non-ST elevated myocardial infarction) (HCC) 07/23/2015   Type 2 diabetes mellitus with complication (HCC) 07/23/2015    Past Surgical History:  Procedure Laterality Date   COLONOSCOPY WITH PROPOFOL N/A 03/15/2018   Procedure: COLONOSCOPY WITH PROPOFOL;  Surgeon: Wyline Mood, MD;  Location: [facility] ENDOSCOPY;  Service: Gastroenterology;  Laterality: N/A;   ESOPHAGOGASTRODUODENOSCOPY (EGD) WITH PROPOFOL N/A 03/15/2018   Procedure: ESOPHAGOGASTRODUODENOSCOPY (EGD) WITH PROPOFOL;  Surgeon: Wyline Mood, MD;  Location: [facility] ENDOSCOPY;  Service: Gastroenterology;  Laterality: N/A;   STENT PLACEMENT VASCULAR ([facility] HX)  07/23/2015   Cardiac Stent Placed    Prior to  Admission medications   Medication Sig Start Date End Date Taking? Authorizing Provider  aspirin 81 MG tablet Take 81 mg by mouth daily.    [provider]  atorvastatin (LIPITOR) 80 MG tablet Take 1 tablet by mouth daily.    [provider]  Blood Glucose Monitoring Suppl (ONETOUCH VERIO) w/Device KIT 1 Device by Does not apply route every morning. 02/06/20   Joni Reining, PA-C  glipiZIDE (GLUCOTROL XL) 10 MG 24 hr tablet Take 1 tablet by mouth once daily with breakfast Patient not taking: Reported on 08/10/2022 07/02/21   Joni Reining, PA-C  glucose blood test strip Use as instructed 02/06/20   Joni Reining, PA-C  Lancets [facility] ULTRASOFT) lancets Use as instructed 02/06/20   Joni Reining, PA-C  lisinopril (ZESTRIL) 40 MG tablet Take 1 tablet by mouth daily.    [provider]  metFORMIN (GLUCOPHAGE) 500 MG tablet Take 2 tablets by mouth twice daily Patient not taking: Reported on 08/10/2022 07/02/21   Joni Reining, PA-C    Allergies Patient has no known allergies.  Family History  Problem Relation Age of Onset   Diabetes Mother    Heart disease Mother    Arthritis Mother    Alcohol abuse Father    Depression Sister    Lung cancer Brother    Depression Brother    Stomach cancer Brother    Mental illness Brother    Alcohol abuse Brother    Heart attack Brother    Alcohol abuse Brother     Social History Social History   Tobacco Use  Smoking status: Former    Packs/day: .25    Types: Cigarettes    Quit date: 10/07/2016    Years since quitting: 6.4   Smokeless tobacco: Never  Vaping Use   Vaping Use: Never used  Substance Use Topics   Alcohol use: No   Drug use: Not Currently    Review of Systems Constitutional: No fever/chills Eyes: No visual changes. ENT: No sore throat. Cardiovascular: Denies chest pain.  Coronary artery disease. Respiratory: Denies shortness of breath. Gastrointestinal: No abdominal pain.  No nausea, no  vomiting.  No diarrhea.  No constipation. Genitourinary: Negative for dysuria. Musculoskeletal: Negative for back pain. Skin: Negative for rash. Neurological: Negative for headaches, focal weakness or numbness. Endocrine: Diabetes, hyperlipidemia, and hypertension.  ____________________________________________   PHYSICAL EXAM:  VITAL SIGNS: BP 177/87  Pulse 88  Resp 14  Temp 97.7 F (36.5 C)  Temp src Temporal  SpO2 97 %  Weight 189 lb (85.7 kg)  Height 5\' 8"  (1.727 m)   BMI 28.74 kg/m2  BSA 2.03 m2  Constitutional: Alert and oriented. Well appearing and in no acute distress. Eyes: Conjunctivae are normal. PERRL. EOMI. Head: Atraumatic. Nose: No congestion/rhinnorhea. Mouth/Throat: Mucous membranes are moist.  Oropharynx non-erythematous. Neck: No stridor.  No cervical spine tenderness to palpation. Hematological/Lymphatic/Immunilogical: No cervical lymphadenopathy. Cardiovascular: Normal rate, regular rhythm. Grossly normal heart sounds.  Good peripheral circulation.  Elevated systolic blood pressure Respiratory: Normal respiratory effort.  No retractions. Lungs CTAB. Gastrointestinal: Soft and nontender. No distention. No abdominal bruits. No CVA tenderness. Genitourinary: Deferred Musculoskeletal: No lower extremity tenderness nor edema.  No joint effusions. Neurologic:  Normal speech and language. No gross focal neurologic deficits are appreciated. No gait instability.  Sensation intact with diabetic foot exam. Skin:  Skin is warm, dry and intact. No rash noted. Psychiatric: Mood and affect are normal. Speech and behavior are normal.  ____________________________________________   LABS         Component Ref Range & Units 4 d ago 1 yr ago 2 yr ago 3 yr ago 5 yr ago  Color, UA Yellow yellow yellow Yellow Yellow R  Clarity, UA Clear clear cloudy Clear   Glucose, UA Negative Positive Abnormal  Negative Positive Abnormal  CM Negative   Comment: 3+  Bilirubin, UA  Negative negative negative Negative Negative R  Ketones, UA Negative negative negative Negative Negative R  Spec Grav, UA 1.010 - 1.025 1.015 >=1.030 Abnormal  1.025 1.025 1.025 R  Blood, UA Negative positive CM +- 10 ery Negative Negative R  pH, UA 5.0 - 8.0 6.0 5.0 6.0 6.0 5.5 R  Protein, UA Negative Positive Abnormal  Positive Abnormal  CM Negative Negative Negative R  Comment: 1+  Urobilinogen, UA 0.2 or 1.0 E.U./dL 0.2 0.2 0.2 0.2   Nitrite, UA Negative negative negative Negative Negative R  Leukocytes, UA Negative Negative Negative Negative Negative 1+ Abnormal   Appearance  foamy light    Odor       Resulting Agency     [facility]                 Component Ref Range & Units 4 d ago (03/10/23) 1 yr ago (05/14/21) 2 yr ago (05/21/20) 3 yr ago (12/25/19) 3 yr ago (06/13/19) 4 yr ago (10/18/18) 4 yr ago (04/19/18) 4 yr ago (04/19/18)  Glucose 70 - 99 mg/dL 191 High  478 High  R 295 High  R  138 High  R     Uric Acid 3.8 -  8.4 mg/dL 4.8 6.4 CM 6.1 CM  6.5 R, CM     Comment:            Therapeutic target for gout patients: <6.0  BUN 8 - 27 mg/dL 11 17 R 19 R 13 R 15 R 11 R    Creatinine, Ser 0.76 - 1.27 mg/dL 1.61 0.96 0.45 4.09 8.11 0.8 R    eGFR >59 mL/min/1.73 100 95        BUN/Creatinine Ratio 10 - 24 13 18  R 20 R 16 R 17 R     Sodium 134 - 144 mmol/L 135 139 135  136     Potassium 3.5 - 5.2 mmol/L 4.4 4.5 4.7  4.3     Chloride 96 - 106 mmol/L 93 Low  101 100  97     Calcium 8.6 - 10.2 mg/dL 9.9 9.4 R 9.3 R  9.2 R     Phosphorus 2.8 - 4.1 mg/dL 3.3 3.2 3.1  3.5     Total Protein 6.0 - 8.5 g/dL 7.6 7.3 7.2  6.9     Albumin 3.8 - 4.9 g/dL 4.4 4.5 4.3  4.4     Globulin, Total 1.5 - 4.5 g/dL 3.2 2.8 2.9  2.5     Albumin/Globulin Ratio 1.2 - 2.2 1.4 1.6 1.5  1.8     Bilirubin Total 0.0 - 1.2 mg/dL 0.8 1.0 0.9  1.3 High      Alkaline Phosphatase 44 - 121 IU/L 92 76 68 R  68 R     LDH 121 - 224 IU/L 172 212 209  209     AST 0 - 40 IU/L 20 31 29   42 High       ALT 0 - 44 IU/L 35 39 41  55 High      GGT 0 - 65 IU/L 37 42 48  55     Iron 38 - 169 ug/dL 68 84 78  914     Cholesterol, Total 100 - 199 mg/dL 782 High  85 Low  73 Low   73 Low      Triglycerides 0 - 149 mg/dL 956 51 68  64   213 R  HDL >39 mg/dL 42 37 Low  29 Low   33 Low    38 R  VLDL Cholesterol Cal 5 - 40 mg/dL 18 13 15  13      LDL Chol Calc (NIH) 0 - 99 mg/dL 086 High  35 29       Chol/HDL Ratio 0.0 - 5.0 ratio 4.8 2.3 CM 2.5 CM  2.2 CM     Comment:                                   T. Chol/HDL Ratio                                             Men  Women                               1/2 Avg.Risk  3.4    3.3  Avg.Risk  5.0    4.4                                2X Avg.Risk  9.6    7.1                                3X Avg.Risk 23.4   11.0  Estimated CHD Risk 0.0 - 1.0 times avg. 1.0  < 0.5 CM  < 0.5 CM   < 0.5 CM     Comment: The CHD Risk is based on the T. Chol/HDL ratio. Other factors affect CHD Risk such as hypertension, smoking, diabetes, severe obesity, and family history of premature CHD.  TSH 0.450 - 4.500 uIU/mL 2.020 1.530 1.370  2.070  1.76 R   T4, Total 4.5 - 12.0 ug/dL 9.7 9.0 7.8  8.6     T3 Uptake Ratio 24 - 39 % 28 25 29  28      Free Thyroxine Index 1.2 - 4.9 2.7 2.3 2.3  2.4     Prostate Specific Ag, Serum 0.0 - 4.0 ng/mL 0.9 0.9 CM 0.8 CM  0.9 CM     Comment: Roche ECLIA methodology. According to the American Urological Association, Serum PSA should decrease and remain at undetectable levels after radical prostatectomy. The AUA defines biochemical recurrence as an initial PSA value 0.2 ng/mL or greater followed by a subsequent confirmatory PSA value 0.2 ng/mL or greater. Values obtained with different assay methods or kits cannot be used interchangeably. Results cannot be interpreted as absolute evidence of the presence or absence of malignant disease.  WBC 3.4 - 10.8 x10E3/uL 6.1 8.1 7.4  8.0     RBC 4.14 -  5.80 x10E6/uL 6.90 High  5.79 5.50  5.71     Hemoglobin 13.0 - 17.7 g/dL 16.1 High  09.6 04.5  40.9     Hematocrit 37.5 - 51.0 % 55.7 High  46.7 45.0  47.0     MCV 79 - 97 fL 81 81 82  82     MCH 26.6 - 33.0 pg 26.1 Low  27.1 26.7  27.1     MCHC 31.5 - 35.7 g/dL 81.1 91.4 78.2  95.6     RDW 11.6 - 15.4 % 14.1 14.4 14.4  14.6     Platelets 150 - 450 x10E3/uL 165 152 120 Low   157     Neutrophils Not Estab. % 48 48 52  60     Lymphs Not Estab. % 42 42 37  29     Monocytes Not Estab. % 8 8 9  8      Eos Not Estab. % 2 2 2  2      Basos Not Estab. % 0 0 0  1     Neutrophils Absolute 1.4 - 7.0 x10E3/uL 2.9 3.8 3.8  4.8     Lymphocytes Absolute 0.7 - 3.1 x10E3/uL 2.5 3.4 High  2.7  2.3     Monocytes Absolute 0.1 - 0.9 x10E3/uL 0.5 0.6 0.7  0.7     EOS (ABSOLUTE) 0.0 - 0.4 x10E3/uL 0.1 0.1 0.2  0.1     Basophils Absolute 0.0 - 0.2 x10E3/uL 0.0 0.0 0.0  0.0     Immature Granulocytes Not Estab. % 0 0 0  0     Immature Grans (Abs)  ____________________________________________  EKG  Sinus rhythm at 67 bpm.  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  Left atrial enlargement.  Anterior lateral ST elevations.  Asymptomatic. ____________________________________________    ____________________________________________   INITIAL IMPRESSION / ASSESSMENT AND PLAN  As part of my medical decision making, I reviewed the following data within the electronic MEDICAL RECORD NUMBER       No acute findings on physical exam.  Discussed lab results with patient which remarkable hemoglobin A1c of 15.  Patient microalbumin/creatinine ratio is 198.  Patient is amenable to consult to endocrinology for definitive evaluation and treatment plan.  Patient will follow-up at this clinic after consult endocrinology.    ____________________________________________   FINAL CLINICAL IMPRESSION Physical exam with uncontrolled diabetes.   ED Discharge  Orders     None        Note:  This document was prepared using Dragon voice recognition software and may include unintentional dictation errors.

## 2023-03-14 NOTE — Progress Notes (Signed)
Pt presents today to complete annual physical. Pt denies any issues or concerns at this time. /CL,RMA  

## 2023-04-23 NOTE — Progress Notes (Signed)
Noted follow up labs have been completed 

## 2023-05-24 ENCOUNTER — Other Ambulatory Visit: Payer: Self-pay

## 2023-05-24 ENCOUNTER — Other Ambulatory Visit (INDEPENDENT_AMBULATORY_CARE_PROVIDER_SITE_OTHER): Payer: 59

## 2023-05-24 DIAGNOSIS — E118 Type 2 diabetes mellitus with unspecified complications: Secondary | ICD-10-CM

## 2023-05-24 LAB — COMPREHENSIVE METABOLIC PANEL
ALT: 27 U/L (ref 0–53)
AST: 18 U/L (ref 0–37)
Albumin: 4.2 g/dL (ref 3.5–5.2)
Alkaline Phosphatase: 66 U/L (ref 39–117)
BUN: 17 mg/dL (ref 6–23)
CO2: 28 mEq/L (ref 19–32)
Calcium: 9.8 mg/dL (ref 8.4–10.5)
Chloride: 96 mEq/L (ref 96–112)
Creatinine, Ser: 0.78 mg/dL (ref 0.40–1.50)
GFR: 96.74 mL/min (ref >60.00)
Glucose, Bld: 320 mg/dL — ABNORMAL HIGH (ref 70–99)
Potassium: 4.1 mEq/L (ref 3.5–5.1)
Sodium: 133 mEq/L — ABNORMAL LOW (ref 135–145)
Total Bilirubin: 1.3 mg/dL — ABNORMAL HIGH (ref 0.2–1.2)
Total Protein: 7.5 g/dL (ref 6.0–8.3)

## 2023-05-24 LAB — MICROALBUMIN / CREATININE URINE RATIO
Creatinine,U: 47.9 mg/dL
Microalb Creat Ratio: 18.6 mg/g (ref 0.0–30.0)
Microalb, Ur: 8.9 mg/dL — ABNORMAL HIGH (ref 0.0–1.9)

## 2023-05-24 LAB — LIPID PANEL
Cholesterol: 206 mg/dL — ABNORMAL HIGH (ref 0–200)
HDL: 46 mg/dL (ref >39.00)
LDL Cholesterol: 139 mg/dL — ABNORMAL HIGH (ref 0–99)
NonHDL: 160.49
Total CHOL/HDL Ratio: 4
Triglycerides: 109 mg/dL (ref 0.0–149.0)
VLDL: 21.8 mg/dL (ref 0.0–40.0)

## 2023-05-24 LAB — HEMOGLOBIN A1C: Hgb A1c MFr Bld: 15.2 % — ABNORMAL HIGH (ref 4.6–6.5)

## 2023-05-25 ENCOUNTER — Other Ambulatory Visit: Payer: 59

## 2023-06-01 ENCOUNTER — Ambulatory Visit: Payer: 59 | Admitting: "Endocrinology

## 2023-06-05 ENCOUNTER — Ambulatory Visit: Payer: 59 | Admitting: "Endocrinology

## 2023-06-26 ENCOUNTER — Telehealth (INDEPENDENT_AMBULATORY_CARE_PROVIDER_SITE_OTHER): Payer: 59 | Admitting: "Endocrinology

## 2023-06-26 ENCOUNTER — Encounter: Payer: Self-pay | Admitting: "Endocrinology

## 2023-06-26 DIAGNOSIS — E7800 Pure hypercholesterolemia, unspecified: Secondary | ICD-10-CM

## 2023-06-26 DIAGNOSIS — Z794 Long term (current) use of insulin: Secondary | ICD-10-CM

## 2023-06-26 DIAGNOSIS — E1165 Type 2 diabetes mellitus with hyperglycemia: Secondary | ICD-10-CM

## 2023-06-26 MED ORDER — LANCET DEVICE MISC: 1.0000 | Freq: Three times a day (TID) | 0 refills | Status: AC

## 2023-06-26 MED ORDER — BLOOD GLUCOSE TEST VI STRP: 1.0000 | ORAL_STRIP | Freq: Three times a day (TID) | 3 refills | Status: AC

## 2023-06-26 MED ORDER — LANTUS SOLOSTAR 100 UNIT/ML ~~LOC~~ SOPN: PEN_INJECTOR | SUBCUTANEOUS | 3 refills | Status: DC

## 2023-06-26 MED ORDER — BLOOD GLUCOSE MONITORING SUPPL DEVI: 1.0000 | Freq: Three times a day (TID) | 0 refills | Status: AC

## 2023-06-26 MED ORDER — LANCETS MISC. MISC: 1.0000 | Freq: Three times a day (TID) | 3 refills | Status: AC

## 2023-06-26 NOTE — Patient Instructions (Addendum)
Start Lantus 20 units once every morning. Increase by 1 units a every day until fasting blood sugar is less than 120. Stay on that dose.  Max dose 80 units/day  We will start you on an extended release version of metformin, which should help with the upset stomach. Start with one 500 mg capsule taken every evening with dinner. Stay on this for 3-5 days, then increase to 1 tablet with breakfast and one with dinner. If you do not have any upset stomach, gradually increase to the goal dose of 2 capsules with breakfast and 2 capsules with dinner. If you do have upset stomach, decrease it back to the previous dose that you tolerated.  Week 1: one tablet after dinner Week 2: one tablet after breakfast and one after dinner  Week 3: one tablet after breakfast and two after dinner  Week 4 and onwards: two tablets after breakfast and two after dinner

## 2023-06-26 NOTE — Progress Notes (Signed)
The patient reports they are currently: [place]. I spent 18 minutes on the video with the patient on the date of service. I spent an additional 15 minutes on pre- and post-visit activities on the date of service.   The patient was physically located in [place] or a state in which I am permitted to provide care. The patient and/or parent/guardian understood that s/he may incur co-pays and cost sharing, and agreed to the telemedicine visit. The visit was reasonable and appropriate under the circumstances given the patient's presentation at the time.  The patient and/or parent/guardian has been advised of the potential risks and limitations of this mode of treatment (including, but not limited to, the absence of in-person examination) and has agreed to be treated using telemedicine. The patient's/patient's family's questions regarding telemedicine have been answered.   The patient and/or parent/guardian has also been advised to contact their provider's office for worsening conditions, and seek emergency medical treatment and/or call 911 if the patient deems either necessary.    Outpatient Endocrinology Note Altamese , MD  06/26/23   Frank Miles 06-16-1961 [id number]  Referring Provider: Joni Reining, PA-C Primary Care Provider: Joni Reining, PA-C Reason for consultation: Subjective   Assessment & Plan  Diagnoses and all orders for this visit:  Uncontrolled type 2 diabetes mellitus with hyperglycemia (HCC)  Pure hypercholesterolemia  Other orders -     insulin glargine (LANTUS SOLOSTAR) 100 UNIT/ML Solostar Pen; Start Lantus 20 units once every morning. Increase by 1 units a every day until fasting blood sugar is less than 120. Stay on that dose.  Max dose 80 units/day -     Blood Glucose Monitoring Suppl DEVI; 1 each by Does not apply route in the morning, at noon, and at bedtime. May substitute to any manufacturer covered by patient's insurance. -     Glucose  Blood (BLOOD GLUCOSE TEST STRIPS) STRP; 1 each by In Vitro route in the morning, at noon, and at bedtime. May substitute to any manufacturer covered by patient's insurance. -     Lancet Device MISC; 1 each by Does not apply route in the morning, at noon, and at bedtime. May substitute to any manufacturer covered by patient's insurance. -     Lancets Misc. MISC; 1 each by Does not apply route in the morning, at noon, and at bedtime. May substitute to any manufacturer covered by patient's insurance.    Diabetes Type II complicated by CAD,  neuropathy  Lab Results  Component Value Date   GFR 96.74 05/24/2023   Hba1c goal less than 7, current Hba1c is  Lab Results  Component Value Date   HGBA1C 15.2 Repeated and verified X2. (H) 05/24/2023   Will recommend the following: Start Lantus 20 units once every morning. Increase by 1 units a every day until fasting blood sugar is less than 120. Stay on that dose.  Max dose 80 units/day  We will start you on an extended release version of metformin, which should help with the upset stomach. Start with one 500 mg capsule taken every evening with dinner. Stay on this for 3-5 days, then increase to 1 tablet with breakfast and one with dinner. If you do not have any upset stomach, gradually increase to the goal dose of 2 capsules with breakfast and 2 capsules with dinner. If you do have upset stomach, decrease it back to the previous dose that you tolerated.  Week 1: one tablet after dinner Week 2: one tablet after  breakfast and one after dinner  Week 3: one tablet after breakfast and two after dinner  Week 4 and onwards: two tablets after breakfast and two after dinner   Stopped metformin 500 mg 2 tabs bid and glipizide XL 10 mg, atorvastatin 80 mg every day and lisinopril 10 mg every day in past. Reports diabetes meds were not working. Not open to insulin but after extensive counseling, patient pen to one shot a day, with the hope to come off of it. Pt  used to give insulin to his mother. Has GI distress with metformin in past.   No known contraindications/side effects to any of above medications  -Last LD and Tg are as follows: Lab Results  Component Value Date   LDLCALC 139 (H) 05/24/2023    Lab Results  Component Value Date   TRIG 109.0 05/24/2023   -Stopped atorvastatin 80 mg QD -Follow low fat diet and exercise   -Blood pressure goal <140/90 - Microalbumin/creatinine goal is < 30 -Last MA/Cr is as follows: Lab Results  Component Value Date   MICROALBUR 8.9 (H) 05/24/2023   -stopped lisinopril 10 mg every day, not on ACE/ARB  -diet changes including salt restriction -limit eating outside -counseled BP targets per standards of diabetes care -uncontrolled blood pressure can lead to retinopathy, nephropathy and cardiovascular and atherosclerotic heart disease  Reviewed and counseled on: -A1C target -Blood sugar targets -Complications of uncontrolled diabetes  -Checking blood sugar before meals and bedtime and bring log next visit -All medications with mechanism of action and side effects -Hypoglycemia management: rule of 15's, Glucagon Emergency Kit and medical alert ID -low-carb low-fat plate-method diet -At least 20 minutes of physical activity per day -Annual dilated retinal eye exam and foot exam -compliance and follow up needs -follow up as scheduled or earlier if problem gets worse  Call if blood sugar is less than 70 or consistently above 250    Take a 15 gm snack of carbohydrate at bedtime before you go to sleep if your blood sugar is less than 100.    If you are going to fast after midnight for a test or procedure, ask your physician for instructions on how to reduce/decrease your insulin dose.    Call if blood sugar is less than 70 or consistently above 250  -Treating a low sugar by rule of 15  (15 gms of sugar every 15 min until sugar is more than 70) If you feel your sugar is low, test your sugar to be  sure If your sugar is low (less than 70), then take 15 grams of a fast acting Carbohydrate (3-4 glucose tablets or glucose gel or 4 ounces of juice or regular soda) Recheck your sugar 15 min after treating low to make sure it is more than 70 If sugar is still less than 70, treat again with 15 grams of carbohydrate          Don't drive the hour of hypoglycemia  If unconscious/unable to eat or drink by mouth, use glucagon injection or nasal spray baqsimi and call 911. Can repeat again in 15 min if still unconscious.  Return in about 6 weeks (around 08/08/2023).   I have reviewed current medications, nurse's notes, allergies, vital signs, past medical and surgical history, family medical history, and social history for this encounter. Counseled patient on symptoms, examination findings, lab findings, imaging results, treatment decisions and monitoring and prognosis. The patient understood the recommendations and agrees with the treatment plan. All questions regarding  treatment plan were fully answered.  Altamese Superior, MD  06/26/23    History of Present Illness Frank Miles is a 62 y.o. year old male who presents for evaluation of Type II diabetes mellitus.  Frank Miles was first diagnosed in 2016    Home diabetes regimen: none  COMPLICATIONS +   MI, - Stroke -  retinopathy +  neuropathy -  nephropathy  SYMPTOMS REVIEWED + Polyuria + Weight loss, trying to lose weight  + Blurred vision  BLOOD SUGAR DATA Not checking BG   Physical Exam  There were no vitals taken for this visit.   Constitutional: well developed, well nourished Head: normocephalic, atraumatic Eyes: sclera anicteric, no redness Neck: supple Lungs: normal respiratory effort Neurology: alert and oriented Skin: dry, no appreciable rashes Musculoskeletal: no appreciable defects Psychiatric: normal mood and affect Diabetic Foot Exam - Simple   No data filed      Current  Medications Patient's Medications  New Prescriptions   BLOOD GLUCOSE MONITORING SUPPL DEVI    1 each by Does not apply route in the morning, at noon, and at bedtime. May substitute to any manufacturer covered by patient's insurance.   GLUCOSE BLOOD (BLOOD GLUCOSE TEST STRIPS) STRP    1 each by In Vitro route in the morning, at noon, and at bedtime. May substitute to any manufacturer covered by patient's insurance.   INSULIN GLARGINE (LANTUS SOLOSTAR) 100 UNIT/ML SOLOSTAR PEN    Start Lantus 20 units once every morning. Increase by 1 units a every day until fasting blood sugar is less than 120. Stay on that dose.  Max dose 80 units/day   LANCET DEVICE MISC    1 each by Does not apply route in the morning, at noon, and at bedtime. May substitute to any manufacturer covered by patient's insurance.   LANCETS MISC. MISC    1 each by Does not apply route in the morning, at noon, and at bedtime. May substitute to any manufacturer covered by patient's insurance.  Previous Medications   ASPIRIN 81 MG TABLET    Take 81 mg by mouth daily.   ATORVASTATIN (LIPITOR) 80 MG TABLET    Take 1 tablet by mouth daily.   BLOOD GLUCOSE MONITORING SUPPL (ONETOUCH VERIO) W/DEVICE KIT    1 Device by Does not apply route every morning.   GLIPIZIDE (GLUCOTROL XL) 10 MG 24 HR TABLET    Take 1 tablet by mouth once daily with breakfast   GLUCOSE BLOOD TEST STRIP    Use as instructed   LANCETS (ONETOUCH ULTRASOFT) LANCETS    Use as instructed   LISINOPRIL (ZESTRIL) 40 MG TABLET    Take 1 tablet by mouth daily.   METFORMIN (GLUCOPHAGE) 500 MG TABLET    Take 2 tablets by mouth twice daily  Modified Medications   No medications on file  Discontinued Medications   No medications on file    Allergies No Known Allergies  Past Medical History Past Medical History:  Diagnosis Date   Anemia    CAD (coronary artery disease)    Diabetes mellitus without complication (HCC)    Elevated lipids    GERD (gastroesophageal reflux  disease)    Hypertension     Past Surgical History Past Surgical History:  Procedure Laterality Date   COLONOSCOPY WITH PROPOFOL N/A 03/15/2018   Procedure: COLONOSCOPY WITH PROPOFOL;  Surgeon: Wyline Mood, MD;  Location: [facility] ENDOSCOPY;  Service: Gastroenterology;  Laterality: N/A;   ESOPHAGOGASTRODUODENOSCOPY (EGD) WITH PROPOFOL N/A 03/15/2018  Procedure: ESOPHAGOGASTRODUODENOSCOPY (EGD) WITH PROPOFOL;  Surgeon: Wyline Mood, MD;  Location: [facility] ENDOSCOPY;  Service: Gastroenterology;  Laterality: N/A;   STENT PLACEMENT VASCULAR ([facility] HX)  07/23/2015   Cardiac Stent Placed    Family History family history includes Alcohol abuse in his brother, brother, and father; Arthritis in his mother; Depression in his brother and sister; Diabetes in his mother; Heart attack in his brother; Heart disease in his mother; Lung cancer in his brother; Mental illness in his brother; Stomach cancer in his brother.  Social History Social History   Socioeconomic History   Marital status: Divorced    Spouse name: Not on file   Number of children: Not on file   Years of education: Not on file   Highest education level: Not on file  Occupational History   Not on file  Tobacco Use   Smoking status: Former    Current packs/day: 0.00    Types: Cigarettes    Quit date: 10/07/2016    Years since quitting: 6.7   Smokeless tobacco: Never  Vaping Use   Vaping status: Never Used  Substance and Sexual Activity   Alcohol use: No   Drug use: Not Currently   Sexual activity: Yes  Other Topics Concern   Not on file  Social History Narrative   Not on file   Social Determinants of Health   Financial Resource Strain: Not on file  Food Insecurity: Not on file  Transportation Needs: Not on file  Physical Activity: Not on file  Stress: Not on file  Social Connections: Not on file  Intimate Partner Violence: Not on file    Lab Results  Component Value Date   HGBA1C 15.2 Repeated and verified X2. (H)  05/24/2023   HGBA1C 15.0 (H) 03/10/2023   HGBA1C 7.9 (H) 11/17/2021   Lab Results  Component Value Date   CHOL 206 (H) 05/24/2023   Lab Results  Component Value Date   HDL 46.00 05/24/2023   Lab Results  Component Value Date   LDLCALC 139 (H) 05/24/2023   Lab Results  Component Value Date   TRIG 109.0 05/24/2023   Lab Results  Component Value Date   CHOLHDL 4 05/24/2023   Lab Results  Component Value Date   CREATININE 0.78 05/24/2023   Lab Results  Component Value Date   GFR 96.74 05/24/2023   Lab Results  Component Value Date   MICROALBUR 8.9 (H) 05/24/2023      Component Value Date/Time   NA 133 (L) 05/24/2023 0852   NA 135 03/10/2023 0948   K 4.1 05/24/2023 0852   CL 96 05/24/2023 0852   CO2 28 05/24/2023 0852   GLUCOSE 320 (H) 05/24/2023 0852   BUN 17 05/24/2023 0852   BUN 11 03/10/2023 0948   CREATININE 0.78 05/24/2023 0852   CALCIUM 9.8 05/24/2023 0852   PROT 7.5 05/24/2023 0852   PROT 7.6 03/10/2023 0948   ALBUMIN 4.2 05/24/2023 0852   ALBUMIN 4.4 03/10/2023 0948   AST 18 05/24/2023 0852   ALT 27 05/24/2023 0852   ALKPHOS 66 05/24/2023 0852   BILITOT 1.3 (H) 05/24/2023 0852   BILITOT 0.8 03/10/2023 0948   GFRNONAA 87 05/21/2020 0945   GFRAA 101 05/21/2020 0945      Latest Ref Rng & Units 05/24/2023    8:52 AM 03/10/2023    9:48 AM 05/14/2021    8:31 AM  BMP  Glucose 70 - 99 mg/dL 096  045  409   BUN 6 -  23 mg/dL 17  11  17    Creatinine 0.40 - 1.50 mg/dL 1.91  4.78  2.95   BUN/Creat Ratio 10 - 24  13  18    Sodium 135 - 145 mEq/L 133  135  139   Potassium 3.5 - 5.1 mEq/L 4.1  4.4  4.5   Chloride 96 - 112 mEq/L 96  93  101   CO2 19 - 32 mEq/L 28     Calcium 8.4 - 10.5 mg/dL 9.8  9.9  9.4        Component Value Date/Time   WBC 6.1 03/10/2023 0948   RBC 6.90 (H) 03/10/2023 0948   HGB 18.0 (H) 03/10/2023 0948   HCT 55.7 (H) 03/10/2023 0948   PLT 165 03/10/2023 0948   MCV 81 03/10/2023 0948   MCH 26.1 (L) 03/10/2023 0948   MCHC 32.3  03/10/2023 0948   RDW 14.1 03/10/2023 0948   LYMPHSABS 2.5 03/10/2023 0948   EOSABS 0.1 03/10/2023 0948   BASOSABS 0.0 03/10/2023 0948     Parts of this note may have been dictated using voice recognition software. There may be variances in spelling and vocabulary which are unintentional. Not all errors are proofread. Please notify the Thereasa Parkin if any discrepancies are noted or if the meaning of any statement is not clear.

## 2023-07-13 ENCOUNTER — Other Ambulatory Visit: Payer: Self-pay

## 2023-07-13 NOTE — Progress Notes (Signed)
Pt completed and cleared pre-employment UDS, HR notified.

## 2024-04-10 ENCOUNTER — Ambulatory Visit: Payer: Self-pay

## 2024-04-10 DIAGNOSIS — Z0000 Encounter for general adult medical examination without abnormal findings: Secondary | ICD-10-CM

## 2024-04-10 DIAGNOSIS — E118 Type 2 diabetes mellitus with unspecified complications: Secondary | ICD-10-CM

## 2024-04-10 LAB — POCT URINALYSIS DIPSTICK
Bilirubin, UA: NEGATIVE
Glucose, UA: POSITIVE — AB
Ketones, UA: NEGATIVE
Leukocytes, UA: NEGATIVE
Nitrite, UA: NEGATIVE
Protein, UA: POSITIVE — AB
Spec Grav, UA: 1.015 (ref 1.010–1.025)
Urobilinogen, UA: 0.2 U/dL
pH, UA: 5.5 (ref 5.0–8.0)

## 2024-04-10 NOTE — Progress Notes (Signed)
Fasting labs, urine and EKG completed pre physical for COB.

## 2024-04-13 LAB — CMP12+LP+TP+TSH+6AC+PSA+CBC…
ALT: 27 IU/L (ref 0–44)
AST: 25 IU/L (ref 0–40)
Albumin: 4.2 g/dL (ref 3.9–4.9)
Alkaline Phosphatase: 88 IU/L (ref 44–121)
BUN/Creatinine Ratio: 15 (ref 10–24)
BUN: 10 mg/dL (ref 8–27)
Basophils Absolute: 0.1 10*3/uL (ref 0.0–0.2)
Basos: 1 %
Bilirubin Total: 1 mg/dL (ref 0.0–1.2)
Calcium: 9.4 mg/dL (ref 8.6–10.2)
Chloride: 94 mmol/L — ABNORMAL LOW (ref 96–106)
Chol/HDL Ratio: 4.3 ratio (ref 0.0–5.0)
Cholesterol, Total: 191 mg/dL (ref 100–199)
Creatinine, Ser: 0.67 mg/dL — ABNORMAL LOW (ref 0.76–1.27)
EOS (ABSOLUTE): 0.1 10*3/uL (ref 0.0–0.4)
Eos: 1 %
Estimated CHD Risk: 0.8 times avg. (ref 0.0–1.0)
Free Thyroxine Index: 2.6 (ref 1.2–4.9)
GGT: 35 IU/L (ref 0–65)
Globulin, Total: 3 g/dL (ref 1.5–4.5)
Glucose: 313 mg/dL — ABNORMAL HIGH (ref 70–99)
HDL: 44 mg/dL (ref >39)
Hematocrit: 54.1 % — ABNORMAL HIGH (ref 37.5–51.0)
Hemoglobin: 16.9 g/dL (ref 13.0–17.7)
Immature Grans (Abs): 0 10*3/uL (ref 0.0–0.1)
Immature Granulocytes: 0 %
Iron: 97 ug/dL (ref 38–169)
LDH: 192 IU/L (ref 121–224)
LDL Chol Calc (NIH): 129 mg/dL — ABNORMAL HIGH (ref 0–99)
Lymphocytes Absolute: 3.4 10*3/uL — ABNORMAL HIGH (ref 0.7–3.1)
Lymphs: 45 %
MCH: 26.3 pg — ABNORMAL LOW (ref 26.6–33.0)
MCHC: 31.2 g/dL — ABNORMAL LOW (ref 31.5–35.7)
MCV: 84 fL (ref 79–97)
Monocytes Absolute: 0.6 10*3/uL (ref 0.1–0.9)
Monocytes: 9 %
Neutrophils Absolute: 3.3 10*3/uL (ref 1.4–7.0)
Neutrophils: 44 %
Phosphorus: 3.1 mg/dL (ref 2.8–4.1)
Platelets: 146 10*3/uL — ABNORMAL LOW (ref 150–450)
Potassium: 4.1 mmol/L (ref 3.5–5.2)
Prostate Specific Ag, Serum: 0.8 ng/mL (ref 0.0–4.0)
RBC: 6.43 x10E6/uL — ABNORMAL HIGH (ref 4.14–5.80)
RDW: 14 % (ref 11.6–15.4)
Sodium: 132 mmol/L — ABNORMAL LOW (ref 134–144)
T3 Uptake Ratio: 26 % (ref 24–39)
T4, Total: 10 ug/dL (ref 4.5–12.0)
TSH: 2.23 u[IU]/mL (ref 0.450–4.500)
Total Protein: 7.2 g/dL (ref 6.0–8.5)
Triglycerides: 100 mg/dL (ref 0–149)
Uric Acid: 5.1 mg/dL (ref 3.8–8.4)
VLDL Cholesterol Cal: 18 mg/dL (ref 5–40)
WBC: 7.5 10*3/uL (ref 3.4–10.8)
eGFR: 106 mL/min/{1.73_m2} (ref >59)

## 2024-04-13 LAB — HGB A1C W/O EAG: Hgb A1c MFr Bld: 15 % — ABNORMAL HIGH (ref 4.8–5.6)

## 2024-04-13 LAB — MICROALBUMIN / CREATININE URINE RATIO
Creatinine, Urine: 51 mg/dL
Microalb/Creat Ratio: 285 mg/g{creat} — ABNORMAL HIGH (ref 0–29)
Microalbumin, Urine: 145.6 ug/mL

## 2024-04-17 ENCOUNTER — Ambulatory Visit: Payer: Self-pay | Admitting: Physician Assistant

## 2024-04-17 ENCOUNTER — Encounter: Payer: Self-pay | Admitting: Physician Assistant

## 2024-04-17 VITALS — BP 146/86 | HR 83 | Temp 97.3°F | Resp 16 | Wt 186.0 lb

## 2024-04-17 DIAGNOSIS — Z0000 Encounter for general adult medical examination without abnormal findings: Secondary | ICD-10-CM

## 2024-04-17 DIAGNOSIS — E118 Type 2 diabetes mellitus with unspecified complications: Secondary | ICD-10-CM

## 2024-04-17 NOTE — Addendum Note (Signed)
 Addended by: Walt Gunner on: 04/17/2024 11:48 AM   Modules accepted: Orders

## 2024-04-17 NOTE — Progress Notes (Signed)
 Stated has no complaints and reportedly a non compliant diabetic not checking his blood sugar nor taking any meds at all.  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

## 2024-04-17 NOTE — Progress Notes (Signed)
 [facility]  ____________________________________________   None    (approximate)  I have reviewed the triage vital signs and the nursing notes.   HISTORY  Chief Complaint No chief complaint on file.   HPI Frank Miles is a 63 y.o. male patient presents for annual physical exam.  Patient is aware that his type 2 diabetes is not controlled.  States he is considering more conservative measures to lower his hemoglobin A1c.  Patient states he does not want to take insulin.  Hemoglobin A1c on this visit is 15.0.        Past Medical History:  Diagnosis Date   Anemia    CAD (coronary artery disease)    Diabetes mellitus without complication (HCC)    Elevated lipids    GERD (gastroesophageal reflux disease)    Hypertension     Patient Active Problem List   Diagnosis Date Noted   Lumbar radiculopathy 08/13/2021   Coronary artery disease involving coronary bypass graft of native heart without angina pectoris 06/20/2019   Class 1 obesity due to excess calories with serious comorbidity and body mass index (BMI) of 31.0 to 31.9 in adult 06/20/2019   Other hyperlipidemia 06/20/2019   Gastroesophageal reflux disease without esophagitis 06/20/2019   Increased liver enzymes 06/20/2019   History of iron deficiency anemia 06/20/2019   Arm pain, right 07/05/2016   Essential hypertension 03/09/2016   NSTEMI (non-ST elevated myocardial infarction) (HCC) 07/23/2015   Type 2 diabetes mellitus with complication (HCC) 07/23/2015    Past Surgical History:  Procedure Laterality Date   COLONOSCOPY WITH PROPOFOL  N/A 03/15/2018   Procedure: COLONOSCOPY WITH PROPOFOL ;  Surgeon: Luke Salaam, MD;  Location: [facility] ENDOSCOPY;  Service: Gastroenterology;  Laterality: N/A;   ESOPHAGOGASTRODUODENOSCOPY (EGD) WITH PROPOFOL  N/A 03/15/2018   Procedure: ESOPHAGOGASTRODUODENOSCOPY (EGD) WITH PROPOFOL ;  Surgeon: Luke Salaam, MD;  Location: [facility] ENDOSCOPY;  Service:  Gastroenterology;  Laterality: N/A;   STENT PLACEMENT VASCULAR ([facility] HX)  07/23/2015   Cardiac Stent Placed    Prior to Admission medications   Medication Sig Start Date End Date Taking? Authorizing Provider  aspirin 81 MG tablet Take 81 mg by mouth daily.    [provider]  atorvastatin  (LIPITOR) 80 MG tablet Take 1 tablet by mouth daily. Patient not taking: Reported on 03/14/2023    [provider]  Blood Glucose Monitoring Suppl (ONETOUCH VERIO) w/Device KIT 1 Device by Does not apply route every morning. Patient not taking: Reported on 03/14/2023 02/06/20   Marcina Severe, PA-C  Blood Glucose Monitoring Suppl DEVI 1 each by Does not apply route in the morning, at noon, and at bedtime. May substitute to any manufacturer covered by patient's insurance. 06/26/23   Motwani, Komal, MD  glipiZIDE  (GLUCOTROL  XL) 10 MG 24 hr tablet Take 1 tablet by mouth once daily with breakfast Patient not taking: Reported on 08/10/2022 07/02/21   Marcina Severe, PA-C  glucose blood test strip Use as instructed Patient not taking: Reported on 03/14/2023 02/06/20   Marcina Severe, PA-C  insulin glargine  (LANTUS  SOLOSTAR) 100 UNIT/ML Solostar Pen Start Lantus  20 units once every morning. Increase by 1 units a every day until fasting blood sugar is less than 120. Stay on that dose.  Max dose 80 units/day 06/26/23   Jorge Newcomer, MD  Lancets [facility] ULTRASOFT) lancets Use as instructed Patient not taking: Reported on 03/14/2023 02/06/20   Marcina Severe, PA-C  lisinopril  (ZESTRIL ) 40 MG tablet Take 1 tablet by mouth  daily. Patient not taking: Reported on 03/14/2023    [provider]  metFORMIN  (GLUCOPHAGE ) 500 MG tablet Take 2 tablets by mouth twice daily Patient not taking: Reported on 08/10/2022 07/02/21   Marcina Severe, PA-C    Allergies Patient has no known allergies.  Family History  Problem Relation Age of Onset   Diabetes Mother    Heart disease Mother    Arthritis Mother     Alcohol abuse Father    Depression Sister    Lung cancer Brother    Depression Brother    Stomach cancer Brother    Mental illness Brother    Alcohol abuse Brother    Heart attack Brother    Alcohol abuse Brother     Social History Social History   Tobacco Use   Smoking status: Former    Current packs/day: 0.00    Types: Cigarettes    Quit date: 10/07/2016    Years since quitting: 7.5   Smokeless tobacco: Never  Vaping Use   Vaping status: Never Used  Substance Use Topics   Alcohol use: No   Drug use: Not Currently    Review of Systems Constitutional: No fever/chills Eyes: No visual changes. ENT: No sore throat. Cardiovascular: Denies chest pain. Respiratory: Denies shortness of breath. Gastrointestinal: No abdominal pain.  No nausea, no vomiting.  No diarrhea.  No constipation. Genitourinary: Negative for dysuria. Musculoskeletal: Negative for back pain. Skin: Negative for rash. Neurological: Negative for headaches, focal weakness or numbness.  Endocrine: Diabetes, hyperlipidemia, and hypertension. ____________________________________________   PHYSICAL EXAM:  VITAL SIGNS: P 146/86BP. 146/86. Data is abnormal. Taken on 04/17/24 9:13 AM  Pulse Rate 83  Temp 97.3 F (36.3 C)Temp. 97.3 F (36.3 C). Data is abnormal. Taken on 04/17/24 9:13 AM  Weight 186 lb (84.4 kg)  Resp 16  SpO2 96 %   BMI: 28.28 kg/m2  BSA: 2.01 m2   Constitutional: Alert and oriented. Well appearing and in no acute distress. Eyes: Conjunctivae are normal. PERRL. EOMI. Head: Atraumatic. Nose: No congestion/rhinnorhea. Mouth/Throat: Mucous membranes are moist.  Oropharynx non-erythematous. Neck: No stridor.  No cervical spine tenderness to palpation. Hematological/Lymphatic/Immunilogical: No cervical lymphadenopathy. Cardiovascular: Normal rate, regular rhythm. Grossly normal heart sounds.  Good peripheral circulation. Respiratory: Normal respiratory effort.  No retractions. Lungs  CTAB. Gastrointestinal: Soft and nontender. No distention. No abdominal bruits. No CVA tenderness. Genitourinary: Deferred Musculoskeletal: No lower extremity tenderness nor edema.  No joint effusions. Neurologic:  Normal speech and language. No gross focal neurologic deficits are appreciated. No gait instability.  Tactile sensory evaluation of the lower extremities within normal limits. Skin:  Skin is warm, dry and intact. No rash noted. Psychiatric: Mood and affect are normal. Speech and behavior are normal.  ____________________________________________   LABS              Component Ref Range & Units (hover) 7 d ago (04/10/24) 10 mo ago (05/24/23) 1 yr ago (03/10/23) 2 yr ago (11/17/21) 2 yr ago (08/25/21) 2 yr ago (05/14/21) 3 yr ago (05/26/20)  Hgb A1c MFr Bld 15.0 High  15.2 Repeated and verified X2. High  R, CM 15.0 High  CM 7.9 High  CM 7.5 Abnormal  R 8.9 High  CM 6.7 Abnormal  R  Comment:          Prediabetes: 5.7 - 6.4          Diabetes: >6.4          Glycemic control for adults with diabetes: <7.0  HbA1c POC (<> result, manual entry)         Resulting Agency [facility]           View All Conversations on this Encounter                   Component Ref Range & Units (hover) 7 d ago (04/10/24) 10 mo ago (05/24/23) 10 mo ago (05/24/23) 1 yr ago (03/10/23) 2 yr ago (05/14/21) 3 yr ago (05/21/20) 4 yr ago (12/25/19)  Glucose 313 High  320 High   336 High  158 High  R 168 High  R   Uric Acid 5.1   4.8 CM 6.4 CM 6.1 CM   Comment:            Therapeutic target for gout patients: <6.0  BUN 10 17 R  11 17 R 19 R 13 R  Creatinine, Ser 0.67 Low  0.78 R  0.84 0.93 0.96 0.83  eGFR 106   100 95    BUN/Creatinine Ratio 15   13 18  R 20 R 16 R  Sodium 132 Low  133 Low  R  135 139 135   Potassium 4.1 4.1 R  4.4 4.5 4.7   Chloride 94 Low  96 R  93 Low  101 100   Calcium  9.4 9.8 R  9.9 9.4 R 9.3 R   Phosphorus 3.1   3.3 3.2 3.1   Total Protein 7.2  7.5 R  7.6 7.3 7.2   Albumin 4.2 4.2 R  4.4 R 4.5 R 4.3 R   Globulin, Total 3.0   3.2 2.8 2.9   Bilirubin Total 1.0 1.3 High  R  0.8 1.0 0.9   Alkaline Phosphatase 88 66 R  92 76 68 R   LDH 192   172 212 209   AST 25 18 R  20 31 29    ALT 27 27 R  35 39 41   GGT 35   37 42 48   Iron 97   68 84 78   Cholesterol, Total 191   201 High  85 Low  73 Low    Triglycerides 100  109.0 R, CM 101 51 68   HDL 44  46.00 R 42 37 Low  29 Low    VLDL Cholesterol Cal 18   18 13 15    LDL Chol Calc (NIH) 129 High    141 High  35 29   Chol/HDL Ratio 4.3  4 R, CM 4.8 CM 2.3 CM 2.5 CM   Comment:                                   T. Chol/HDL Ratio                                             Men  Women                               1/2 Avg.Risk  3.4    3.3                                   Avg.Risk  5.0    4.4                                2X Avg.Risk  9.6    7.1                                3X Avg.Risk 23.4   11.0  Estimated CHD Risk 0.8   1.0 CM  < 0.5 CM  < 0.5 CM   Comment: The CHD Risk is based on the T. Chol/HDL ratio. Other factors affect CHD Risk such as hypertension, smoking, diabetes, severe obesity, and family history of premature CHD.  TSH 2.230   2.020 1.530 1.370   T4, Total 10.0   9.7 9.0 7.8   T3 Uptake Ratio 26   28 25 29    Free Thyroxine Index 2.6   2.7 2.3 2.3   Prostate Specific Ag, Serum 0.8   0.9 CM 0.9 CM 0.8 CM   Comment: Roche ECLIA methodology. According to the American Urological Association, Serum PSA should decrease and remain at undetectable levels after radical prostatectomy. The AUA defines biochemical recurrence as an initial PSA value 0.2 ng/mL or greater followed by a subsequent confirmatory PSA value 0.2 ng/mL or greater. Values obtained with different assay methods or kits cannot be used interchangeably. Results cannot be interpreted as absolute evidence of the presence or absence of malignant disease.  WBC 7.5   6.1 8.1 7.4   RBC 6.43 High    6.90 High  5.79  5.50   Hemoglobin 16.9   18.0 High  15.7 14.7   Hematocrit 54.1 High    55.7 High  46.7 45.0   MCV 84   81 81 82   MCH 26.3 Low    26.1 Low  27.1 26.7   MCHC 31.2 Low    32.3 33.6 32.7   RDW 14.0   14.1 14.4 14.4   Platelets 146 Low    165 152 120 Low    Neutrophils 44   48 48 52   Lymphs 45   42 42 37   Monocytes 9   8 8 9    Eos 1   2 2 2    Basos 1   0 0 0   Neutrophils Absolute 3.3   2.9 3.8 3.8   Lymphocytes Absolute 3.4 High    2.5 3.4 High  2.7   Monocytes Absolute 0.6   0.5 0.6 0.7   EOS (ABSOLUTE) 0.1   0.1 0.1 0.2   Basophils Absolute 0.1   0.0 0.0 0.0   Immature Granulocytes 0   0 0 0   Immature Grans (Abs) 0.0   0.0 0.0 0.0   Resulting Agency [facility] [place] [facility] [place] [facility]          View All Conversations on this Encounter                Component Ref Range & Units (hover) 7 d ago 1 yr ago 2 yr ago 3 yr ago 4 yr ago 6 yr ago  Color, UA yellow Yellow yellow yellow Yellow Yellow R  Clarity, UA clear Clear clear cloudy Clear   Glucose, UA Positive Abnormal  Positive Abnormal  CM Negative Positive Abnormal  CM Negative   Comment: 3+  Bilirubin, UA neg Negative negative negative Negative Negative R  Ketones, UA  neg Negative negative negative Negative Negative R  Spec Grav, UA 1.015 1.015 >=1.030 Abnormal  1.025 1.025 1.025 R  Blood, UA + Negative positive CM +- 10 ery Negative Negative R  Comment: 10  pH, UA 5.5 6.0 5.0 6.0 6.0 5.5 R  Protein, UA Positive Abnormal  Positive Abnormal  CM Positive Abnormal  CM Negative Negative Negative R  Comment: 15  Urobilinogen, UA 0.2 0.2 0.2 0.2 0.2   Nitrite, UA neg Negative negative negative Negative Negative R  Leukocytes, UA Negative Negative Negative Negative Negative 1+ Abnormal   Appearance light  foamy light    Odor none       Resulting Agency      [facility]             View All Conversations on this Encounter             Component Ref Range & Units (hover) 7 d ago 10  mo ago 1 yr ago 2 yr ago 4 yr ago  Creatinine, Urine 51.0  71.8 129.5 119.8  Microalbumin, Urine 145.6  142.1 42.5 15.9  Microalb/Creat Ratio 285 High  18.6 R 198 High  CM 33 High  CM 13 CM  Comment:                        Normal:                0 -  29                        Moderately increased: 30 - 300                        Severely increased:       >300  Resulting Agency [facility] [place] [facility]                  Component Ref Range & Units (hover) 7 d ago (04/10/24) 10 mo ago (05/24/23) 10 mo ago (05/24/23) 1 yr ago (03/10/23) 2 yr ago (05/14/21) 3 yr ago (05/21/20) 4 yr ago (12/25/19)  Glucose 313 High  320 High   336 High  158 High  R 168 High  R   Uric Acid 5.1   4.8 CM 6.4 CM 6.1 CM   Comment:            Therapeutic target for gout patients: <6.0  BUN 10 17 R  11 17 R 19 R 13 R  Creatinine, Ser 0.67 Low  0.78 R  0.84 0.93 0.96 0.83  eGFR 106   100 95    BUN/Creatinine Ratio 15   13 18  R 20 R 16 R  Sodium 132 Low  133 Low  R  135 139 135   Potassium 4.1 4.1 R  4.4 4.5 4.7   Chloride 94 Low  96 R  93 Low  101 100   Calcium  9.4 9.8 R  9.9 9.4 R 9.3 R   Phosphorus 3.1   3.3 3.2 3.1   Total Protein 7.2 7.5 R  7.6 7.3 7.2   Albumin 4.2 4.2 R  4.4 R 4.5 R 4.3 R   Globulin, Total 3.0   3.2 2.8 2.9   Bilirubin Total 1.0 1.3 High  R  0.8 1.0 0.9   Alkaline Phosphatase 88 66 R  92 76 68 R   LDH 192   172 212 209  AST 25 18 R  20 31 29    ALT 27 27 R  35 39 41   GGT 35   37 42 48   Iron 97   68 84 78   Cholesterol, Total 191   201 High  85 Low  73 Low    Triglycerides 100  109.0 R, CM 101 51 68   HDL 44  46.00 R 42 37 Low  29 Low    VLDL Cholesterol Cal 18   18 13 15    LDL Chol Calc (NIH) 129 High    141 High  35 29   Chol/HDL Ratio 4.3  4 R, CM 4.8 CM 2.3 CM 2.5 CM   Comment:                                   T. Chol/HDL Ratio                                             Men  Women                               1/2 Avg.Risk  3.4    3.3                                    Avg.Risk  5.0    4.4                                2X Avg.Risk  9.6    7.1                                3X Avg.Risk 23.4   11.0  Estimated CHD Risk 0.8   1.0 CM  < 0.5 CM  < 0.5 CM   Comment: The CHD Risk is based on the T. Chol/HDL ratio. Other factors affect CHD Risk such as hypertension, smoking, diabetes, severe obesity, and family history of premature CHD.  TSH 2.230   2.020 1.530 1.370   T4, Total 10.0   9.7 9.0 7.8   T3 Uptake Ratio 26   28 25 29    Free Thyroxine Index 2.6   2.7 2.3 2.3   Prostate Specific Ag, Serum 0.8   0.9 CM 0.9 CM 0.8 CM   Comment: Roche ECLIA methodology. According to the American Urological Association, Serum PSA should decrease and remain at undetectable levels after radical prostatectomy. The AUA defines biochemical recurrence as an initial PSA value 0.2 ng/mL or greater followed by a subsequent confirmatory PSA value 0.2 ng/mL or greater. Values obtained with different assay methods or kits cannot be used interchangeably. Results cannot be interpreted as absolute evidence of the presence or absence of malignant disease.  WBC 7.5   6.1 8.1 7.4   RBC 6.43 High    6.90 High  5.79 5.50   Hemoglobin 16.9   18.0 High  15.7 14.7   Hematocrit 54.1 High    55.7 High  46.7 45.0   MCV 84   81 81 82   MCH 26.3 Low  26.1 Low  27.1 26.7   MCHC 31.2 Low    32.3 33.6 32.7   RDW 14.0   14.1 14.4 14.4   Platelets 146 Low    165 152 120 Low    Neutrophils 44   48 48 52   Lymphs 45   42 42 37   Monocytes 9   8 8 9    Eos 1   2 2 2    Basos 1   0 0 0   Neutrophils Absolute 3.3   2.9 3.8 3.8   Lymphocytes Absolute 3.4 High    2.5 3.4 High  2.7   Monocytes Absolute 0.6   0.5 0.6 0.7   EOS (ABSOLUTE) 0.1   0.1 0.1 0.2   Basophils Absolute 0.1   0.0 0.0 0.0   Immature Granulocytes 0   0 0 0   Immature Grans (Abs) 0.0   0.0 0.0 0.0   Resulting                       ____________________________________________  EKG  Sinus rhythm at  62 bpm.  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  Left atrial enlargement.  Anterior lateral ST elevation.  Asymptomatic. ____________________________________________    ____________________________________________   INITIAL IMPRESSION / ASSESSMENT AND PLAN   As part of my medical decision making, I reviewed the following data within the electronic MEDICAL RECORD NUMBER      No acute findings on physical exam.  EKG reveals incomplete right bundle branch block and left axis anterior fascicular block.  Left atrial enlargement.  Anterior lateral elevation.  No change from previous EKG.  Discussed lab results showing elevated hemoglobin A1c at 15.0.  Glucosuria and proteinuria.  Patient is amenable to endocrinologist consult to discuss treatment options.        ____________________________________________   FINAL CLINICAL IMPRESSION Annual physical exam of uncontrolled diabetes.   ED Discharge Orders     None        Note:  This document was prepared using Dragon voice recognition software and may include unintentional dictation errors.

## 2024-05-08 DIAGNOSIS — E785 Hyperlipidemia, unspecified: Secondary | ICD-10-CM | POA: Diagnosis not present

## 2024-05-08 DIAGNOSIS — E1129 Type 2 diabetes mellitus with other diabetic kidney complication: Secondary | ICD-10-CM | POA: Diagnosis not present

## 2024-05-08 DIAGNOSIS — E1159 Type 2 diabetes mellitus with other circulatory complications: Secondary | ICD-10-CM | POA: Diagnosis not present

## 2024-05-08 DIAGNOSIS — R809 Proteinuria, unspecified: Secondary | ICD-10-CM | POA: Diagnosis not present

## 2024-05-08 DIAGNOSIS — I152 Hypertension secondary to endocrine disorders: Secondary | ICD-10-CM | POA: Diagnosis not present

## 2024-05-08 DIAGNOSIS — E1169 Type 2 diabetes mellitus with other specified complication: Secondary | ICD-10-CM | POA: Diagnosis not present

## 2024-05-08 DIAGNOSIS — E1165 Type 2 diabetes mellitus with hyperglycemia: Secondary | ICD-10-CM | POA: Diagnosis not present

## 2024-05-08 DIAGNOSIS — E1140 Type 2 diabetes mellitus with diabetic neuropathy, unspecified: Secondary | ICD-10-CM | POA: Diagnosis not present

## 2024-05-22 DIAGNOSIS — E1165 Type 2 diabetes mellitus with hyperglycemia: Secondary | ICD-10-CM | POA: Diagnosis not present

## 2024-05-22 DIAGNOSIS — R809 Proteinuria, unspecified: Secondary | ICD-10-CM | POA: Diagnosis not present

## 2024-05-22 DIAGNOSIS — E1169 Type 2 diabetes mellitus with other specified complication: Secondary | ICD-10-CM | POA: Diagnosis not present

## 2024-05-22 DIAGNOSIS — E1159 Type 2 diabetes mellitus with other circulatory complications: Secondary | ICD-10-CM | POA: Diagnosis not present

## 2024-05-22 DIAGNOSIS — E1140 Type 2 diabetes mellitus with diabetic neuropathy, unspecified: Secondary | ICD-10-CM | POA: Diagnosis not present

## 2024-05-22 DIAGNOSIS — E1129 Type 2 diabetes mellitus with other diabetic kidney complication: Secondary | ICD-10-CM | POA: Diagnosis not present

## 2024-05-22 DIAGNOSIS — I152 Hypertension secondary to endocrine disorders: Secondary | ICD-10-CM | POA: Diagnosis not present

## 2024-05-22 DIAGNOSIS — E785 Hyperlipidemia, unspecified: Secondary | ICD-10-CM | POA: Diagnosis not present

## 2024-07-20 ENCOUNTER — Other Ambulatory Visit: Payer: Self-pay

## 2024-07-20 ENCOUNTER — Emergency Department

## 2024-07-20 ENCOUNTER — Inpatient Hospital Stay
Admission: EM | Admit: 2024-07-20 | Discharge: 2024-07-23 | DRG: 321 | Disposition: A | Discharge status: Home / Self Care | Attending: Student | Admitting: Student

## 2024-07-20 DIAGNOSIS — Z8249 Family history of ischemic heart disease and other diseases of the circulatory system: Secondary | ICD-10-CM

## 2024-07-20 DIAGNOSIS — I252 Old myocardial infarction: Secondary | ICD-10-CM

## 2024-07-20 DIAGNOSIS — Z794 Long term (current) use of insulin: Secondary | ICD-10-CM

## 2024-07-20 DIAGNOSIS — I160 Hypertensive urgency: Secondary | ICD-10-CM | POA: Diagnosis present

## 2024-07-20 DIAGNOSIS — Z87891 Personal history of nicotine dependence: Secondary | ICD-10-CM

## 2024-07-20 DIAGNOSIS — E785 Hyperlipidemia, unspecified: Secondary | ICD-10-CM | POA: Diagnosis present

## 2024-07-20 DIAGNOSIS — R079 Chest pain, unspecified: Secondary | ICD-10-CM | POA: Diagnosis not present

## 2024-07-20 DIAGNOSIS — I214 Non-ST elevation (NSTEMI) myocardial infarction: Principal | ICD-10-CM | POA: Diagnosis present

## 2024-07-20 DIAGNOSIS — Z955 Presence of coronary angioplasty implant and graft: Secondary | ICD-10-CM

## 2024-07-20 DIAGNOSIS — Z79899 Other long term (current) drug therapy: Secondary | ICD-10-CM

## 2024-07-20 DIAGNOSIS — I2510 Atherosclerotic heart disease of native coronary artery without angina pectoris: Secondary | ICD-10-CM | POA: Diagnosis present

## 2024-07-20 DIAGNOSIS — Z833 Family history of diabetes mellitus: Secondary | ICD-10-CM

## 2024-07-20 DIAGNOSIS — E1165 Type 2 diabetes mellitus with hyperglycemia: Secondary | ICD-10-CM

## 2024-07-20 DIAGNOSIS — I110 Hypertensive heart disease with heart failure: Secondary | ICD-10-CM | POA: Diagnosis present

## 2024-07-20 DIAGNOSIS — Z7982 Long term (current) use of aspirin: Secondary | ICD-10-CM

## 2024-07-20 DIAGNOSIS — I5031 Acute diastolic (congestive) heart failure: Secondary | ICD-10-CM | POA: Diagnosis present

## 2024-07-20 HISTORY — DX: Hyperlipidemia, unspecified: E78.5

## 2024-07-20 HISTORY — DX: Cardiomegaly: I51.7

## 2024-07-20 HISTORY — DX: Type 2 diabetes mellitus without complications: E11.9

## 2024-07-20 HISTORY — DX: Polyneuropathy, unspecified: G62.9

## 2024-07-20 LAB — BASIC METABOLIC PANEL WITH GFR
Anion gap: 13 (ref 5–15)
BUN: 22 mg/dL (ref 8–23)
CO2: 27 mmol/L (ref 22–32)
Calcium: 9.6 mg/dL (ref 8.9–10.3)
Chloride: 96 mmol/L — ABNORMAL LOW (ref 98–111)
Creatinine, Ser: 0.77 mg/dL (ref 0.61–1.24)
GFR, Estimated: >60 mL/min (ref >60)
Glucose, Bld: 299 mg/dL — ABNORMAL HIGH (ref 70–99)
Potassium: 3.7 mmol/L (ref 3.5–5.1)
Sodium: 136 mmol/L (ref 135–145)

## 2024-07-20 LAB — CBC
HCT: 48.7 % (ref 39.0–52.0)
Hemoglobin: 16.3 g/dL (ref 13.0–17.0)
MCH: 26.8 pg (ref 26.0–34.0)
MCHC: 33.5 g/dL (ref 30.0–36.0)
MCV: 80.1 fL (ref 80.0–100.0)
Platelets: 159 K/uL (ref 150–400)
RBC: 6.08 MIL/uL — ABNORMAL HIGH (ref 4.22–5.81)
RDW: 13.9 % (ref 11.5–15.5)
WBC: 9.1 K/uL (ref 4.0–10.5)
nRBC: 0 % (ref 0.0–0.2)

## 2024-07-20 LAB — TROPONIN I (HIGH SENSITIVITY): Troponin I (High Sensitivity): 19 ng/L — ABNORMAL HIGH (ref <18)

## 2024-07-20 NOTE — ED Provider Notes (Signed)
 [facility] Provider Note    Event Date/Time   First MD Initiated Contact with Patient 07/20/24 2258     (approximate)   History   Chest Pain   HPI Jordyn Doane Peugh is a 63 y.o. male who has a prior history of coronary artery disease status post 1 coronary artery stent placement about 7 years ago.  He has lost a bunch of weight and stopped smoking and exercises regularly.  He takes a daily baby aspirin .  He has not seen his cardiologist for several years.  He presents tonight for acute onset chest pain that occurred multiple time starting around 8 PM.  He said that he was sweaty associated with the episodes but had no shortness of breath and did not pass out.  He said that he does not normally have pain and it worried him so he thought he should get checked out.  He currently is asymptomatic and pain-free.  No recent travel, no unilateral leg pain or swelling and he remains active and exercises regularly.  No history of blood clots in the legs of the lungs.     Physical Exam   Triage Vital Signs: ED Triage Vitals  Encounter Vitals Group     BP 07/20/24 2144 (!) 237/102     Girls Systolic BP Percentile --      Girls Diastolic BP Percentile --      Boys Systolic BP Percentile --      Boys Diastolic BP Percentile --      Pulse Rate 07/20/24 2142 67     Resp 07/20/24 2142 18     Temp 07/20/24 2142 98.4 F (36.9 C)     Temp Source 07/20/24 2142 Oral     SpO2 07/20/24 2142 99 %     Weight 07/20/24 2139 84.4 kg (186 lb 1.1 oz)     Height --      Head Circumference --      Peak Flow --      Pain Score 07/20/24 2138 8     Pain Loc --      Pain Education --      Exclude from Growth Chart --     Most recent vital signs: Vitals:   07/21/24 0100 07/21/24 0200  BP: (!) 181/83 (!) 184/89  Pulse: (!) 55 (!) 51  Resp: 20 13  Temp: 98 F (36.7 C)   SpO2: 100% 100%    General: Awake, no distress.  Appears younger than chronological age, healthy  body habitus. CV:  Good peripheral perfusion.  Normal heart sounds, borderline bradycardia. Resp:  Normal effort. Speaking easily and comfortably, no accessory muscle usage nor intercostal retractions.  Lungs are clear to auscultation. Abd:  No distention.  No tenderness to palpation of the abdomen.   ED Results / Procedures / Treatments   Labs (all labs ordered are listed, but only abnormal results are displayed) Labs Reviewed  BASIC METABOLIC PANEL WITH GFR - Abnormal; Notable for the following components:      Result Value   Chloride 96 (*)    Glucose, Bld 299 (*)    All other components within normal limits  CBC - Abnormal; Notable for the following components:   RBC 6.08 (*)    All other components within normal limits  CBG MONITORING, ED - Abnormal; Notable for the following components:   Glucose-Capillary 270 (*)    All other components within normal limits  TROPONIN I (HIGH SENSITIVITY) - Abnormal; Notable  for the following components:   Troponin I (High Sensitivity) 19 (*)    All other components within normal limits  TROPONIN I (HIGH SENSITIVITY) - Abnormal; Notable for the following components:   Troponin I (High Sensitivity) 278 (*)    All other components within normal limits  PROTIME-INR  HIV ANTIBODY (ROUTINE TESTING W REFLEX)  LIPOPROTEIN A (LPA)  HEPARIN  LEVEL (UNFRACTIONATED)     EKG  ED ECG REPORT I, Darleene Dome, the attending physician, personally viewed and interpreted this ECG.  Date: 07/20/2024 EKG Time: 21: 44 Rate: 68 Rhythm: normal sinus rhythm QRS Axis: normal Intervals: Left anterior fascicular block ST/T Wave abnormalities: Non-specific ST segment / T-wave changes, but no clear evidence of acute ischemia. Narrative Interpretation: no definitive evidence of acute ischemia; does not meet STEMI criteria.    RADIOLOGY I independently viewed and interpreted the patient's two-view chest x-ray. No evidence of PNA.  I also read the  radiologist's report, which confirmed no acute findings.   KG PROCEDURES:  Critical Care performed: Yes, see critical care procedure note(s)  .1-3 Lead EKG Interpretation  Performed by: Dome Darleene, MD Authorized by: Dome Darleene, MD     Interpretation: abnormal     ECG rate:  52   ECG rate assessment: bradycardic     Rhythm: sinus bradycardia     Ectopy: none     Conduction: normal   .Critical Care  Performed by: Dome Darleene, MD Authorized by: Dome Darleene, MD   Critical care provider statement:    Critical care time (minutes):  30   Critical care time was exclusive of:  Separately billable procedures and treating other patients   Critical care was necessary to treat or prevent imminent or life-threatening deterioration of the following conditions:  Cardiac failure (NSTEMI)   Critical care was time spent personally by me on the following activities:  Development of treatment plan with patient or surrogate, evaluation of patient's response to treatment, examination of patient, obtaining history from patient or surrogate, ordering and performing treatments and interventions, ordering and review of laboratory studies, ordering and review of radiographic studies, pulse oximetry, re-evaluation of patient's condition and review of old charts     IMPRESSION / MDM / ASSESSMENT AND PLAN / ED COURSE  I reviewed the triage vital signs and the nursing notes.                              Differential diagnosis includes, but is not limited to, ACS, pneumonia, PE, musculoskeletal strain, GI related chest pain.  Patient's presentation is most consistent with acute presentation with potential threat to life or bodily function.  Labs/studies ordered: High-sensitivity troponin, BMP, CBC, two-view chest x-ray, EKG  Interventions/Medications given:  Medications  heparin  ADULT infusion 100 units/mL (25000 units/250mL) (1,150 Units/hr Intravenous New Bag/Given 07/21/24 0107)  aspirin  EC  tablet 81 mg (has no administration in time range)  atorvastatin  (LIPITOR) tablet 80 mg (has no administration in time range)  lisinopril  (ZESTRIL ) tablet 40 mg (has no administration in time range)  insulin  glargine (LANTUS ) injection 15 Units (has no administration in time range)  nitroGLYCERIN  (NITROSTAT ) SL tablet 0.4 mg (has no administration in time range)  acetaminophen  (TYLENOL ) tablet 650 mg (has no administration in time range)  ondansetron  (ZOFRAN ) injection 4 mg (has no administration in time range)  insulin  aspart (novoLOG ) injection 0-15 Units (has no administration in time range)  insulin  aspart (novoLOG ) injection 0-5 Units (3  Units Subcutaneous Given 07/21/24 0145)  hydrALAZINE  (APRESOLINE ) injection 5 mg (has no administration in time range)  aspirin  chewable tablet 324 mg (324 mg Oral Given 07/21/24 0105)  heparin  bolus via infusion 4,000 Units (4,000 Units Intravenous Bolus from Bag 07/21/24 0107)  metoprolol  tartrate (LOPRESSOR ) injection 5 mg (5 mg Intravenous Given 07/21/24 0145)    (Note:  hospital course my include additional interventions and/or labs/studies not listed above.)   Patient is hypertensive but otherwise vital signs are appropriate.  No ST segment elevation on EKG and the patient is currently asymptomatic.  Unfortunately his initial high-sensitivity troponin was 19 and his second was greater than 200.  This is an NSTEMI until proven otherwise.  I ordered full dose aspirin  and heparin  bolus plus infusion.  I talked with the patient about the need to stay in the hospital and the presumed diagnosis.  He understands and agrees.  I consult the hospitalist and discussed the case with Dr. Cleatus who will admit the patient  The patient is on the cardiac monitor to evaluate for evidence of arrhythmia and/or significant heart rate changes.       FINAL CLINICAL IMPRESSION(S) / ED DIAGNOSES   Final diagnoses:  NSTEMI (non-ST elevated myocardial infarction) (HCC)      Rx / DC Orders   ED Discharge Orders     None        Note:  This document was prepared using Dragon voice recognition software and may include unintentional dictation errors.   Gordan Huxley, MD 07/21/24 [phone]

## 2024-07-20 NOTE — ED Triage Notes (Addendum)
 Left sided chest pain and tightness started 1 hour ago. Denies any n/v or SOB. Pain does not radiate. Pt states he has been feeling numb on left side and had chills for a few day. Denies any numbness today.

## 2024-07-21 ENCOUNTER — Encounter: Payer: Self-pay | Admitting: Internal Medicine

## 2024-07-21 DIAGNOSIS — I5030 Unspecified diastolic (congestive) heart failure: Secondary | ICD-10-CM | POA: Diagnosis not present

## 2024-07-21 DIAGNOSIS — I110 Hypertensive heart disease with heart failure: Secondary | ICD-10-CM | POA: Diagnosis not present

## 2024-07-21 DIAGNOSIS — I214 Non-ST elevation (NSTEMI) myocardial infarction: Secondary | ICD-10-CM | POA: Diagnosis not present

## 2024-07-21 DIAGNOSIS — Z79899 Other long term (current) drug therapy: Secondary | ICD-10-CM | POA: Diagnosis not present

## 2024-07-21 DIAGNOSIS — I5031 Acute diastolic (congestive) heart failure: Secondary | ICD-10-CM | POA: Diagnosis not present

## 2024-07-21 DIAGNOSIS — Z7982 Long term (current) use of aspirin: Secondary | ICD-10-CM | POA: Diagnosis not present

## 2024-07-21 DIAGNOSIS — I2510 Atherosclerotic heart disease of native coronary artery without angina pectoris: Secondary | ICD-10-CM | POA: Diagnosis not present

## 2024-07-21 DIAGNOSIS — Z87891 Personal history of nicotine dependence: Secondary | ICD-10-CM | POA: Diagnosis not present

## 2024-07-21 DIAGNOSIS — Z955 Presence of coronary angioplasty implant and graft: Secondary | ICD-10-CM

## 2024-07-21 DIAGNOSIS — I160 Hypertensive urgency: Secondary | ICD-10-CM

## 2024-07-21 DIAGNOSIS — E785 Hyperlipidemia, unspecified: Secondary | ICD-10-CM

## 2024-07-21 DIAGNOSIS — I10 Essential (primary) hypertension: Secondary | ICD-10-CM | POA: Diagnosis not present

## 2024-07-21 DIAGNOSIS — E1165 Type 2 diabetes mellitus with hyperglycemia: Secondary | ICD-10-CM

## 2024-07-21 DIAGNOSIS — Z8249 Family history of ischemic heart disease and other diseases of the circulatory system: Secondary | ICD-10-CM | POA: Diagnosis not present

## 2024-07-21 DIAGNOSIS — R079 Chest pain, unspecified: Secondary | ICD-10-CM | POA: Diagnosis not present

## 2024-07-21 DIAGNOSIS — I252 Old myocardial infarction: Secondary | ICD-10-CM | POA: Diagnosis not present

## 2024-07-21 DIAGNOSIS — Z833 Family history of diabetes mellitus: Secondary | ICD-10-CM | POA: Diagnosis not present

## 2024-07-21 DIAGNOSIS — Z794 Long term (current) use of insulin: Secondary | ICD-10-CM | POA: Diagnosis not present

## 2024-07-21 LAB — CBG MONITORING, ED
Glucose-Capillary: 270 mg/dL — ABNORMAL HIGH (ref 70–99)
Glucose-Capillary: 197 mg/dL — ABNORMAL HIGH (ref 70–99)
Glucose-Capillary: 235 mg/dL — ABNORMAL HIGH (ref 70–99)
Glucose-Capillary: 196 mg/dL — ABNORMAL HIGH (ref 70–99)
Glucose-Capillary: 193 mg/dL — ABNORMAL HIGH (ref 70–99)

## 2024-07-21 LAB — VITAMIN B12: Vitamin B-12: 655 pg/mL (ref 180–914)

## 2024-07-21 LAB — IRON AND TIBC
Iron: 77 ug/dL (ref 45–182)
Saturation Ratios: 21 % (ref 17.9–39.5)
TIBC: 375 ug/dL (ref 250–450)
UIBC: 298 ug/dL

## 2024-07-21 LAB — HEPARIN LEVEL (UNFRACTIONATED)
Heparin Unfractionated: 0.47 [IU]/mL (ref 0.30–0.70)
Heparin Unfractionated: 0.28 [IU]/mL — ABNORMAL LOW (ref 0.30–0.70)
Heparin Unfractionated: 0.51 [IU]/mL (ref 0.30–0.70)

## 2024-07-21 LAB — PROTIME-INR
INR: 1.1 (ref 0.8–1.2)
Prothrombin Time: 14.4 s (ref 11.4–15.2)

## 2024-07-21 LAB — TROPONIN I (HIGH SENSITIVITY)
Troponin I (High Sensitivity): 278 ng/L (ref <18)
Troponin I (High Sensitivity): 9333 ng/L (ref <18)

## 2024-07-21 LAB — VITAMIN D 25 HYDROXY (VIT D DEFICIENCY, FRACTURES): Vit D, 25-Hydroxy: 32.62 ng/mL (ref 30–100)

## 2024-07-21 LAB — MAGNESIUM: Magnesium: 2 mg/dL (ref 1.7–2.4)

## 2024-07-21 LAB — HIV ANTIBODY (ROUTINE TESTING W REFLEX): HIV Screen 4th Generation wRfx: NONREACTIVE

## 2024-07-21 MED ORDER — FREE WATER
500.0000 mL | Freq: Once | Status: AC
  Administered 2024-07-22: 500 mL via ORAL
  Filled 2024-07-21: qty 500

## 2024-07-21 MED ORDER — IRBESARTAN 150 MG PO TABS
150.0000 mg | ORAL_TABLET | Freq: Every day | ORAL | Status: DC
  Administered 2024-07-21: 150 mg via ORAL
  Filled 2024-07-21 (×2): qty 1

## 2024-07-21 MED ORDER — HEPARIN (PORCINE) 25000 UT/250ML-% IV SOLN
1300.0000 [IU]/h | INTRAVENOUS | Status: DC
Start: 2024-07-21 — End: 2024-07-22
  Administered 2024-07-21: 1150 [IU]/h via INTRAVENOUS
  Administered 2024-07-21: 1300 [IU]/h via INTRAVENOUS
  Filled 2024-07-21 (×2): qty 250

## 2024-07-21 MED ORDER — HEPARIN BOLUS VIA INFUSION
4000.0000 [IU] | Freq: Once | INTRAVENOUS | Status: AC
  Administered 2024-07-21: 4000 [IU] via INTRAVENOUS
  Filled 2024-07-21: qty 4000

## 2024-07-21 MED ORDER — ONDANSETRON HCL 4 MG/2ML IJ SOLN: 4.0000 mg | Freq: Four times a day (QID) | INTRAMUSCULAR | Status: DC | PRN

## 2024-07-21 MED ORDER — LISINOPRIL 10 MG PO TABS
40.0000 mg | ORAL_TABLET | Freq: Every day | ORAL | Status: DC
  Filled 2024-07-21: qty 4

## 2024-07-21 MED ORDER — NITROGLYCERIN 0.4 MG SL SUBL: 0.4000 mg | SUBLINGUAL_TABLET | SUBLINGUAL | Status: DC | PRN

## 2024-07-21 MED ORDER — NITROGLYCERIN 2 % TD OINT
1.0000 [in_us] | TOPICAL_OINTMENT | Freq: Four times a day (QID) | TRANSDERMAL | Status: DC
  Administered 2024-07-21 – 2024-07-22 (×5): 1 [in_us] via TOPICAL
  Filled 2024-07-21 (×5): qty 1

## 2024-07-21 MED ORDER — ASPIRIN 81 MG PO CHEW
324.0000 mg | CHEWABLE_TABLET | Freq: Once | ORAL | Status: AC
  Administered 2024-07-21: 324 mg via ORAL
  Filled 2024-07-21: qty 4

## 2024-07-21 MED ORDER — HEPARIN BOLUS VIA INFUSION
1200.0000 [IU] | Freq: Once | INTRAVENOUS | Status: AC
  Administered 2024-07-21: 1200 [IU] via INTRAVENOUS
  Filled 2024-07-21: qty 1200

## 2024-07-21 MED ORDER — ASPIRIN 81 MG PO TBEC: 81.0000 mg | DELAYED_RELEASE_TABLET | Freq: Every day | ORAL | Status: DC

## 2024-07-21 MED ORDER — METOPROLOL TARTRATE 5 MG/5ML IV SOLN
5.0000 mg | Freq: Once | INTRAVENOUS | Status: AC
  Administered 2024-07-21: 5 mg via INTRAVENOUS
  Filled 2024-07-21: qty 5

## 2024-07-21 MED ORDER — INSULIN GLARGINE 100 UNIT/ML ~~LOC~~ SOLN
15.0000 [IU] | Freq: Every day | SUBCUTANEOUS | Status: DC
  Administered 2024-07-21 – 2024-07-22 (×2): 15 [IU] via SUBCUTANEOUS
  Filled 2024-07-21 (×4): qty 0.15

## 2024-07-21 MED ORDER — ASPIRIN 81 MG PO TBEC
81.0000 mg | DELAYED_RELEASE_TABLET | Freq: Every day | ORAL | Status: DC
  Administered 2024-07-23: 81 mg via ORAL
  Filled 2024-07-21: qty 1

## 2024-07-21 MED ORDER — ATORVASTATIN CALCIUM 80 MG PO TABS
80.0000 mg | ORAL_TABLET | Freq: Every day | ORAL | Status: DC
  Administered 2024-07-21 – 2024-07-23 (×2): 80 mg via ORAL
  Filled 2024-07-21: qty 1
  Filled 2024-07-21: qty 4

## 2024-07-21 MED ORDER — ASPIRIN 81 MG PO CHEW
81.0000 mg | CHEWABLE_TABLET | ORAL | Status: AC
  Administered 2024-07-22: 81 mg via ORAL
  Filled 2024-07-21: qty 1

## 2024-07-21 MED ORDER — HYDRALAZINE HCL 20 MG/ML IJ SOLN
5.0000 mg | INTRAMUSCULAR | Status: AC | PRN
Start: 2024-07-21 — End: ?
  Administered 2024-07-22: 5 mg via INTRAVENOUS
  Filled 2024-07-21: qty 1

## 2024-07-21 MED ORDER — INSULIN ASPART 100 UNIT/ML IJ SOLN
0.0000 [IU] | Freq: Every day | INTRAMUSCULAR | Status: DC
  Administered 2024-07-21: 3 [IU] via SUBCUTANEOUS
  Filled 2024-07-21: qty 3

## 2024-07-21 MED ORDER — ACETAMINOPHEN 325 MG PO TABS: 650.0000 mg | ORAL_TABLET | ORAL | Status: DC | PRN

## 2024-07-21 MED ORDER — INSULIN ASPART 100 UNIT/ML IJ SOLN
0.0000 [IU] | Freq: Three times a day (TID) | INTRAMUSCULAR | Status: DC
  Administered 2024-07-21 (×2): 3 [IU] via SUBCUTANEOUS
  Administered 2024-07-21: 5 [IU] via SUBCUTANEOUS
  Administered 2024-07-22: 8 [IU] via SUBCUTANEOUS
  Administered 2024-07-22 – 2024-07-23 (×2): 3 [IU] via SUBCUTANEOUS
  Filled 2024-07-21 (×6): qty 1

## 2024-07-21 NOTE — Progress Notes (Addendum)
 PHARMACY - ANTICOAGULATION CONSULT NOTE  Pharmacy Consult for Heparin   Indication: chest pain/ACS  No Known Allergies  Patient Measurements: Height: 5' 8 (172.7 cm) Weight: 83 kg (183 lb) IBW/kg (Calculated) : 68.4 HEPARIN  DW (KG): 83  Vital Signs: Temp: 98.2 F (36.8 C) (09/14 1200) Temp Source: Oral (09/14 1200) BP: 158/89 (09/14 1200) Pulse Rate: 58 (09/14 1200)  Labs: Recent Labs    07/20/24 2148 07/20/24 2336 07/21/24 0057 07/21/24 0557 07/21/24 1000 07/21/24 1441  HGB 16.3  --   --   --   --   --   HCT 48.7  --   --   --   --   --   PLT 159  --   --   --   --   --   LABPROT  --   --  14.4  --   --   --   INR  --   --  1.1  --   --   --   HEPARINUNFRC  --   --   --  0.47  --  0.28*  CREATININE 0.77  --   --   --   --   --   TROPONINIHS 19* 278*  --   --  9,333*  --     Estimated Creatinine Clearance: 101.8 mL/min (by C-G formula based on SCr of 0.77 mg/dL).   Medical History: Past Medical History:  Diagnosis Date   Anemia    CAD (coronary artery disease)    a. 07/2015 NSTEMI/PCI [facility]): LM nl, LAD mild diff dzs up to 25%, D1-6 nl, LCX min irregs, OM1 95 (3.5x28 Promus DES), RCA 50p/m, RPDA/PL nl.   GERD (gastroesophageal reflux disease)    Hyperlipidemia LDL goal <55    Hypertension    LVH (left ventricular hypertrophy)    a. 07/2015 Echo: EF 65-70%, mod LVH, nl RV fxn.   Neuropathy    Type 2 diabetes mellitus (HCC)    a. 04/2024 HbA1c 15.0.    Medications:  (Not in a hospital admission)   Assessment: Pharmacy consulted to dose heparin  in this 63 year old male admitted with ACS/NSTEMI.  No prior anticoag. CrCl = 101.8 ml/min  Goal of Therapy:  Heparin  level 0.3-0.7 units/ml Monitor platelets by anticoagulation protocol: Yes  9/14: HL @ 0557 = 0.47, therapeutic X 1 9/14: HL @ 1441 = 0.28, SUBtherapeutic   Plan:  9/14: HL @ 1441 = 0.28, SUBtherapeutic Give 1200 units bolus x1; then increase rate of heparin  infusion to 1300 units/hour. Check  heparin  level in 6 hours, then daily once at least two levels are consecutively therapeutic. Continue to monitor CBC daily while on heparin  infusion.   Will M. Lenon, PharmD, BCPS Clinical Pharmacist 07/21/2024 3:47 PM

## 2024-07-21 NOTE — H&P (Addendum)
 History and Physical    Patient: Frank Miles FMW:[id number] DOB: July 25, 1961 DOA: 07/20/2024 DOS: the patient was seen and examined on 07/21/2024 PCP: Claudene Tanda POUR, PA-C  Patient coming from: Home  Chief Complaint:  Chief Complaint  Patient presents with   Chest Pain    HPI: Frank Miles is a 63 y.o. male with medical history significant for Insulin -dependent type 2 diabetes, HTN, history of NSTEMI 2016 s/p LCx stent, being admitted with NSTEMI.  He presented with sudden onset left-sided chest pain starting an hour prior to arrival.  He states the pain felt like something was stuck in the middle of his chest when he took an antacid.  It was nonradiating.  He had associated diaphoresis without nausea or vomiting or shortness of breath and no lightheadedness or palpitations.  He was previously in his usual state of health. On arrival BP 237/102 with otherwise normal vitals. Labs notable for Troponin 19-->278. Glucose 299 EKG showed NSR at 68 with no ischemic ST-T wave changes Chest x-ray nonacute Patient given chewable aspirin  324 mg, started on a heparin  drip. Admission requested.     Review of Systems: As mentioned in the history of present illness. All other systems reviewed and are negative.  Past Medical History:  Diagnosis Date   Anemia    CAD (coronary artery disease)    Diabetes mellitus without complication (HCC)    Elevated lipids    GERD (gastroesophageal reflux disease)    Hypertension    Past Surgical History:  Procedure Laterality Date   COLONOSCOPY WITH PROPOFOL  N/A 03/15/2018   Procedure: COLONOSCOPY WITH PROPOFOL ;  Surgeon: Therisa Bi, MD;  Location: [facility] ENDOSCOPY;  Service: Gastroenterology;  Laterality: N/A;   ESOPHAGOGASTRODUODENOSCOPY (EGD) WITH PROPOFOL  N/A 03/15/2018   Procedure: ESOPHAGOGASTRODUODENOSCOPY (EGD) WITH PROPOFOL ;  Surgeon: Therisa Bi, MD;  Location: [facility] ENDOSCOPY;  Service: Gastroenterology;  Laterality: N/A;   STENT  PLACEMENT VASCULAR ([facility] HX)  07/23/2015   Cardiac Stent Placed   Social History:  reports that he quit smoking about 7 years ago. His smoking use included cigarettes. He has never used smokeless tobacco. He reports that he does not currently use drugs. He reports that he does not drink alcohol.  No Known Allergies  Family History  Problem Relation Age of Onset   Diabetes Mother    Heart disease Mother    Arthritis Mother    Alcohol abuse Father    Depression Sister    Lung cancer Brother    Depression Brother    Stomach cancer Brother    Mental illness Brother    Alcohol abuse Brother    Heart attack Brother    Alcohol abuse Brother     Prior to Admission medications   Medication Sig Start Date End Date Taking? Authorizing Provider  aspirin  81 MG tablet Take 81 mg by mouth daily.    [provider]  atorvastatin  (LIPITOR) 80 MG tablet Take 1 tablet by mouth daily. Patient not taking: Reported on 04/17/2024    [provider]  Blood Glucose Monitoring Suppl (ONETOUCH VERIO) w/Device KIT 1 Device by Does not apply route every morning. Patient not taking: Reported on 04/17/2024 02/06/20   Claudene Tanda POUR, PA-C  Blood Glucose Monitoring Suppl DEVI 1 each by Does not apply route in the morning, at noon, and at bedtime. May substitute to any manufacturer covered by patient's insurance. Patient not taking: Reported on 04/17/2024 06/26/23   Motwani, Komal, MD  glipiZIDE  (GLUCOTROL  XL) 10 MG 24 hr  tablet Take 1 tablet by mouth once daily with breakfast Patient not taking: Reported on 04/17/2024 07/02/21   Claudene Tanda POUR, PA-C  glucose blood test strip Use as instructed Patient not taking: Reported on 04/17/2024 02/06/20   Claudene Tanda POUR, PA-C  insulin  glargine (LANTUS  SOLOSTAR) 100 UNIT/ML Solostar Pen Start Lantus  20 units once every morning. Increase by 1 units a every day until fasting blood sugar is less than 120. Stay on that dose.  Max dose 80 units/day Patient not taking:  Reported on 04/17/2024 06/26/23   Dartha Ernst, MD  Lancets [facility] ULTRASOFT) lancets Use as instructed Patient not taking: Reported on 04/17/2024 02/06/20   Claudene Tanda POUR, PA-C  lisinopril  (ZESTRIL ) 40 MG tablet Take 1 tablet by mouth daily. Patient not taking: Reported on 04/17/2024    [provider]  metFORMIN  (GLUCOPHAGE ) 500 MG tablet Take 2 tablets by mouth twice daily Patient not taking: Reported on 04/17/2024 07/02/21   Claudene Tanda POUR, PA-C    Physical Exam: Vitals:   07/20/24 2139 07/20/24 2142 07/20/24 2144 07/21/24 0056  BP:   (!) 237/102   Pulse:  67    Resp:  18    Temp:  98.4 F (36.9 C)    TempSrc:  Oral    SpO2:  99%    Weight: 84.4 kg   83 kg  Height:    5' 8 (1.727 m)   Physical Exam Vitals and nursing note reviewed.  Constitutional:      General: He is not in acute distress. HENT:     Head: Normocephalic and atraumatic.  Cardiovascular:     Rate and Rhythm: Normal rate and regular rhythm.     Heart sounds: Normal heart sounds.  Pulmonary:     Effort: Pulmonary effort is normal.     Breath sounds: Normal breath sounds.  Abdominal:     Palpations: Abdomen is soft.     Tenderness: There is no abdominal tenderness.  Neurological:     Mental Status: Mental status is at baseline.     Labs on Admission: I have personally reviewed following labs and imaging studies  CBC: Recent Labs  Lab 07/20/24 2148  WBC 9.1  HGB 16.3  HCT 48.7  MCV 80.1  PLT 159   Basic Metabolic Panel: Recent Labs  Lab 07/20/24 2148  NA 136  K 3.7  CL 96*  CO2 27  GLUCOSE 299*  BUN 22  CREATININE 0.77  CALCIUM  9.6   GFR: Estimated Creatinine Clearance: 101.8 mL/min (by C-G formula based on SCr of 0.77 mg/dL). Liver Function Tests: No results for input(s): AST, ALT, ALKPHOS, BILITOT, PROT, ALBUMIN in the last 168 hours. No results for input(s): LIPASE, AMYLASE in the last 168 hours. No results for input(s): AMMONIA in the last 168  hours. Coagulation Profile: No results for input(s): INR, PROTIME in the last 168 hours. Cardiac Enzymes: No results for input(s): CKTOTAL, CKMB, CKMBINDEX, TROPONINI in the last 168 hours. BNP (last 3 results) No results for input(s): PROBNP in the last 8760 hours. HbA1C: No results for input(s): HGBA1C in the last 72 hours. CBG: No results for input(s): GLUCAP in the last 168 hours. Lipid Profile: No results for input(s): CHOL, HDL, LDLCALC, TRIG, CHOLHDL, LDLDIRECT in the last 72 hours. Thyroid Function Tests: No results for input(s): TSH, T4TOTAL, FREET4, T3FREE, THYROIDAB in the last 72 hours. Anemia Panel: No results for input(s): VITAMINB12, FOLATE, FERRITIN, TIBC, IRON, RETICCTPCT in the last 72 hours. Urine analysis:  Component Value Date/Time   APPEARANCEUR Clear 03/14/2018 1309   GLUCOSEU Negative 03/14/2018 1309   BILIRUBINUR neg 04/10/2024 0828   BILIRUBINUR Negative 03/14/2018 1309   PROTEINUR Positive (A) 04/10/2024 0828   PROTEINUR Negative 03/14/2018 1309   UROBILINOGEN 0.2 04/10/2024 0828   NITRITE neg 04/10/2024 0828   NITRITE Negative 03/14/2018 1309   LEUKOCYTESUR Negative 04/10/2024 0828   LEUKOCYTESUR 1+ (A) 03/14/2018 1309    Radiological Exams on Admission: DG Chest 2 View Result Date: 07/20/2024 CLINICAL DATA:  Left-sided chest pain for 1 hour, initial encounter EXAM: CHEST - 2 VIEW COMPARISON:  None Available. FINDINGS: The heart size and mediastinal contours are within normal limits. Both lungs are clear. The visualized skeletal structures are unremarkable. IMPRESSION: No active cardiopulmonary disease. Electronically Signed   By: Oneil Devonshire M.D.   On: 07/20/2024 22:06   Data Reviewed for HPI: Relevant notes from primary care and specialist visits, past discharge summaries as available in EHR, including Care Everywhere. Prior diagnostic testing as pertinent to current admission  diagnoses Updated medications and problem lists for reconciliation ED course, including vitals, labs, imaging, treatment and response to treatment Triage notes, nursing and pharmacy notes and ED provider's notes Notable results as noted above in HPI      Assessment and Plan: * NSTEMI (non-ST elevated myocardial infarction) (HCC) History of NSTEMI 2016 s/p LCx stent Continue aspirin  and atorvastatin  Continue heparin  infusion NTG sublingual as needed chest pain with morphine for breakthrough Metoprolol  for BP control Cardiology consulted  Hypertensive urgency IV metoprolol  x 1-2 to transition to oral and continue home lisinopril   Uncontrolled type 2 diabetes mellitus with hyperglycemia, with long-term current use of insulin  (HCC) Continue Lantus  Sliding scale insulin  coverage     DVT prophylaxis:  heparin  infusion  Consults:  [facility] cardiology  Advance Care Planning: full code  Family Communication: none  Disposition Plan: Back to previous home environment  Severity of Illness: The appropriate patient status for this patient is OBSERVATION. Observation status is judged to be reasonable and necessary in order to provide the required intensity of service to ensure the patient's safety. The patient's presenting symptoms, physical exam findings, and initial radiographic and laboratory data in the context of their medical condition is felt to place them at decreased risk for further clinical deterioration. Furthermore, it is anticipated that the patient will be medically stable for discharge from the hospital within 2 midnights of admission.   Author: Delayne LULLA Solian, MD 07/21/2024 1:08 AM  For on call review [email or web address].

## 2024-07-21 NOTE — H&P (View-Only) (Signed)
 Cardiology Consult    Patient ID: Frank Miles MRN: [id number], DOB/AGE: June 26, 1961   Admit date: 07/20/2024 Date of Consult: 07/21/2024  Primary Physician: Claudene Tanda POUR, PA-C Primary Cardiologist: None - new   Requesting Provider: CHARM Sor, MD  Patient Profile    Frank Miles is a 63 y.o. male with a history of CAD status post non-STEMI and prior OM1 stenting in September 2021, LVH, hypertension, hyperlipidemia, poorly controlled diabetes (A1c is 15.0 in June 2025), GERD, and neuropathy, who is being seen today for the evaluation of hypertensive urgency and non-STEMI at the request of Dr. Sor.  Past Medical History   Subjective  Past Medical History:  Diagnosis Date   Anemia    CAD (coronary artery disease)    a. 07/2015 NSTEMI/PCI [facility]): LM nl, LAD mild diff dzs up to 25%, D1-6 nl, LCX min irregs, OM1 95 (3.5x28 Promus DES), RCA 50p/m, RPDA/PL nl.   GERD (gastroesophageal reflux disease)    Hyperlipidemia LDL goal <55    Hypertension    LVH (left ventricular hypertrophy)    a. 07/2015 Echo: EF 65-70%, mod LVH, nl RV fxn.   Neuropathy    Type 2 diabetes mellitus (HCC)    a. 04/2024 HbA1c 15.0.    Past Surgical History:  Procedure Laterality Date   COLONOSCOPY WITH PROPOFOL  N/A 03/15/2018   Procedure: COLONOSCOPY WITH PROPOFOL ;  Surgeon: Therisa Bi, MD;  Location: [facility] ENDOSCOPY;  Service: Gastroenterology;  Laterality: N/A;   ESOPHAGOGASTRODUODENOSCOPY (EGD) WITH PROPOFOL  N/A 03/15/2018   Procedure: ESOPHAGOGASTRODUODENOSCOPY (EGD) WITH PROPOFOL ;  Surgeon: Therisa Bi, MD;  Location: [facility] ENDOSCOPY;  Service: Gastroenterology;  Laterality: N/A;   STENT PLACEMENT VASCULAR ([facility] HX)  07/23/2015   Cardiac Stent Placed     Allergies  No Known Allergies     History of Present Illness   63 year old male with the above past medical history including CAD, hypertension, hyperlipidemia, LVH, poorly controlled diabetes, GERD, and neuropathy.  Patient was  previously admitted to [facility] in September 2016 in the setting of non-STEMI.  Echo showed an EF of 65 to 70% with moderate LVH and normal RV function.  Diagnostic catheterization revealed mild diffuse LAD disease, moderate RCA disease, and a 95% stenosis in the first obtuse marginal, which was felt to be the culprit vessel was successfully stented with a 3.5 x 28 mm Promus drug-eluting stent.  He has not been followed by cardiology since.  Patient has had poorly controlled diabetes and has been followed at the [facility] occupational health clinic.  His A1c was 15.0 in June 2025 and he establish care with endocrinology in July, and has since been taking Lantus .  He lives in [place] with his wife.  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  He does not routinely exercise but had not been having chest pain or dyspnea.  Mr. Wiedeman was in his usual state of health until he evening of September 12, when he returned home after having a steak dinner and was sitting on the couch watching TV about an hour later, when he had sudden onset of 7/10 substernal chest heaviness, pressure, and burning.  He said it felt like something was stuck without a piece of steak had not gone down right.  He became diaphoretic but denies dyspnea, nausea, or vomiting.  He took a nitroglycerin , which he notes was several years old and also chewed some Tums without immediate  relief.  He contacted a friend who drove him to the emergency department.    On arrival to the ED, he was afebrile and markedly hypertensive at 237/102.  ECG showed sinus rhythm @ 69 w/ LAD, LAFB, and LAE.  Labs notable for gluc of 299, HsTrop of 19  278, H/H nl @ 16.3/48.7.  CXR w/o active cardiopulmonary disease.  He was provided ASA, heparin , IV metoprolol , and insulin .  Pain resolved within about 2-1/2 hours of onset.  BP 169/86 this AM.  He has not had any recurrent chest  discomfort.  Wife at bedside.   Inpatient Medications   Subjective    [START ON 07/22/2024] aspirin  EC  81 mg Oral Daily   atorvastatin   80 mg Oral Daily   insulin  aspart  0-15 Units Subcutaneous TID WC   insulin  aspart  0-5 Units Subcutaneous QHS   insulin  glargine  15 Units Subcutaneous QHS   lisinopril   40 mg Oral Daily    Family History    Family History  Problem Relation Age of Onset   Diabetes Mother    Heart disease Mother    Arthritis Mother    Alcohol abuse Father    Depression Sister    Lung cancer Brother    Depression Brother    Stomach cancer Brother    Mental illness Brother    Alcohol abuse Brother    Heart attack Brother    Alcohol abuse Brother    He indicated that his mother is deceased. He indicated that his father is deceased. He indicated that his sister is alive. He indicated that two of his four brothers are deceased.   Social History    Social History   Socioeconomic History   Marital status: Divorced    Spouse name: Not on file   Number of children: Not on file   Years of education: Not on file   Highest education level: Not on file  Occupational History   Not on file  Tobacco Use   Smoking status: Former    Current packs/day: 0.00    Types: Cigarettes    Quit date: 10/07/2016    Years since quitting: 7.7   Smokeless tobacco: Never  Vaping Use   Vaping status: Never Used  Substance and Sexual Activity   Alcohol use: No   Drug use: Not Currently   Sexual activity: Yes  Other Topics Concern   Not on file  Social History Narrative   Lives locally with wife.  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   Social Drivers of Corporate investment banker Strain: Low Risk  (05/08/2024)   Received from [facility]   Overall Financial Resource Strain (CARDIA)    Difficulty of Paying Living Expenses: Not hard at all  Food Insecurity: No Food Insecurity  (05/08/2024)   Received from [facility]   Hunger Vital Sign    Within the past 12 months, you worried that your food would run out before you got the money to buy more.: Never true    Within the past 12 months, the food you bought just didn't last and you didn't have money to get more.: Never true  Transportation Needs: No Transportation Needs (05/08/2024)   Received from [facility] - Transportation    In the past 12 months, has lack of transportation kept you from medical appointments or from getting  medications?: No    Lack of Transportation (Non-Medical): No  Physical Activity: Not on file  Stress: Not on file  Social Connections: Not on file  Intimate Partner Violence: Not on file     Review of Systems    General:  No chills, fever, night sweats or weight changes.  Cardiovascular:  +++ chest pain, +++ diaphoresis, no dyspnea on exertion, edema, orthopnea, palpitations, paroxysmal nocturnal dyspnea. Dermatological: No rash, lesions/masses Respiratory: No cough, dyspnea Urologic: No hematuria, dysuria Abdominal:   No nausea, vomiting, diarrhea, bright red blood per rectum, melena, or hematemesis Neurologic:  No visual changes, wkns, changes in mental status. All other systems reviewed and are otherwise negative except as noted above.     Objective   Physical Exam    Blood pressure (!) 169/86, pulse 60, temperature (!) 97.5 F (36.4 C), temperature source Oral, resp. rate 16, height 5' 8 (1.727 m), weight 83 kg, SpO2 99%.  General: Pleasant, NAD Psych: Normal affect. Neuro: Alert and oriented X 3. Moves all extremities spontaneously. HEENT: Normal  Neck: Supple without bruits or JVD. Lungs:  Resp regular and unlabored, CTA. Heart: RRR no s3, s4, or murmurs. Abdomen: Soft, non-tender, non-distended, BS + x 4.  Extremities: No clubbing, cyanosis or edema. DP/PT2+, Radials 2+ and equal bilaterally.  Labs    Cardiac  Enzymes Recent Labs  Lab 07/20/24 2148 07/20/24 2336  TROPONINIHS 19* 278*    Lab Results  Component Value Date   WBC 9.1 07/20/2024   HGB 16.3 07/20/2024   HCT 48.7 07/20/2024   MCV 80.1 07/20/2024   PLT 159 07/20/2024    Recent Labs  Lab 07/20/24 2148  NA 136  K 3.7  CL 96*  CO2 27  BUN 22  CREATININE 0.77  CALCIUM  9.6  GLUCOSE 299*   Lab Results  Component Value Date   CHOL 191 04/10/2024   HDL 44 04/10/2024   LDLCALC 129 (H) 04/10/2024   TRIG 100 04/10/2024     Radiology Studies    DG Chest 2 View Result Date: 07/20/2024 CLINICAL DATA:  Left-sided chest pain for 1 hour, initial encounter EXAM: CHEST - 2 VIEW COMPARISON:  None Available. FINDINGS: The heart size and mediastinal contours are within normal limits. Both lungs are clear. The visualized skeletal structures are unremarkable. IMPRESSION: No active cardiopulmonary disease. Electronically Signed   By: Oneil Devonshire M.D.   On: 07/20/2024 22:06      ECG & Cardiac Imaging    Regular sinus rhythm @ 69 w/ LAD, LAFB, and LAE - personally reviewed.  Assessment & Plan    1.  Non-STEMI/CAD: Patient with prior history of non-STEMI status post OM1 drug-eluting stent placement in September 2016 at [facility].  EF was normal at that time.  He has not been followed by cardiology since.  Patient presented to the emergency department on the evening of September 13 due to midsternal chest pressure and burning associate with diaphoresis.  He was markedly hypertensive on admission with a blood pressure of 237/102.  ECG showed no acute ST or T changes.  Chest pain resolved after approximately 2 to 2-1/2 hours,  And he has had no recurrence.  He was not having chest pain or dyspnea at home prior to this event.  Follow-up troponins this morning.  In light of symptoms, elevated troponin, and high suspicion for progression of coronary disease in the setting of poorly controlled diabetes, we discussed the role of diagnostic catheterization.   The patient understands that risks  include but are not limited to stroke (1 in 1000), death (1 in 1000), kidney failure [usually temporary] (1 in 500), bleeding (1 in 200), allergic reaction [possibly serious] (1 in 200), and agrees to proceed.  Will place on board for tomorrow and reiterated that if he has any any recurrent chest discomfort, that he is to notify nursing staff.  Continue aspirin , high potency statin statin, and heparin  therapy.  Baseline bradycardia prevents initiation of beta-blocker therapy.  Add Nitropaste.  2.  Hypertensive urgency: As above, blood pressure 237/102 on admission.  He received 1 dose of IV metoprolol  overnight blood pressure remains elevated today at 169/86 with a heart rate of 60.  He is on lisinopril  20 mg daily at home and this has been increased to 40 mg here, though he has not received any yet.  In case EF reduced and Entresto potentially appropriate, will change lisinopril  to valsartan.  Resting heart rate in the 50s overnight will preclude initiation of beta-blocker.  Follow blood pressures throughout the day and consider addition of amlodipine.  3.  Hyperlipidemia: LDL was 129 in June, at which time he was statin nave.  Continue atorvastatin  80 mg and plan to follow-up lipids in the outpatient setting.  4.  Poorly controlled type 2 diabetes mellitus: A1c 15.0 in June.  Followed by endocrinology as an outpatient and has been taking Lantus .  Management per medicine team.  5.  ? Sleep apnea: Apneic episodes noted on monitoring overnight.  Will need outpatient sleep study.  Risk Assessment/Risk Scores:     TIMI Risk Score for Unstable Angina or Non-ST Elevation MI:   The patient's TIMI risk score is 4, which indicates a 20% risk of all cause mortality, new or recurrent myocardial infarction or need for urgent revascularization in the next 14 days.      Signed, Lonni Meager, NP 07/21/2024, 8:31 AM  For questions or updates, please contact   Please  consult [email or web address] for contact info under Cardiology/STEMI.

## 2024-07-21 NOTE — Consult Note (Addendum)
 Cardiology Consult    Patient ID: Frank Miles MRN: [id number], DOB/AGE: June 26, 1961   Admit date: 07/20/2024 Date of Consult: 07/21/2024  Primary Physician: Claudene Tanda POUR, PA-C Primary Cardiologist: None - new   Requesting Provider: CHARM Sor, MD  Patient Profile    Frank Miles is a 63 y.o. male with a history of CAD status post non-STEMI and prior OM1 stenting in September 2021, LVH, hypertension, hyperlipidemia, poorly controlled diabetes (A1c is 15.0 in June 2025), GERD, and neuropathy, who is being seen today for the evaluation of hypertensive urgency and non-STEMI at the request of Dr. Sor.  Past Medical History   Subjective  Past Medical History:  Diagnosis Date   Anemia    CAD (coronary artery disease)    a. 07/2015 NSTEMI/PCI [facility]): LM nl, LAD mild diff dzs up to 25%, D1-6 nl, LCX min irregs, OM1 95 (3.5x28 Promus DES), RCA 50p/m, RPDA/PL nl.   GERD (gastroesophageal reflux disease)    Hyperlipidemia LDL goal <55    Hypertension    LVH (left ventricular hypertrophy)    a. 07/2015 Echo: EF 65-70%, mod LVH, nl RV fxn.   Neuropathy    Type 2 diabetes mellitus (HCC)    a. 04/2024 HbA1c 15.0.    Past Surgical History:  Procedure Laterality Date   COLONOSCOPY WITH PROPOFOL  N/A 03/15/2018   Procedure: COLONOSCOPY WITH PROPOFOL ;  Surgeon: Therisa Bi, MD;  Location: [facility] ENDOSCOPY;  Service: Gastroenterology;  Laterality: N/A;   ESOPHAGOGASTRODUODENOSCOPY (EGD) WITH PROPOFOL  N/A 03/15/2018   Procedure: ESOPHAGOGASTRODUODENOSCOPY (EGD) WITH PROPOFOL ;  Surgeon: Therisa Bi, MD;  Location: [facility] ENDOSCOPY;  Service: Gastroenterology;  Laterality: N/A;   STENT PLACEMENT VASCULAR ([facility] HX)  07/23/2015   Cardiac Stent Placed     Allergies  No Known Allergies     History of Present Illness   63 year old male with the above past medical history including CAD, hypertension, hyperlipidemia, LVH, poorly controlled diabetes, GERD, and neuropathy.  Patient was  previously admitted to [facility] in September 2016 in the setting of non-STEMI.  Echo showed an EF of 65 to 70% with moderate LVH and normal RV function.  Diagnostic catheterization revealed mild diffuse LAD disease, moderate RCA disease, and a 95% stenosis in the first obtuse marginal, which was felt to be the culprit vessel was successfully stented with a 3.5 x 28 mm Promus drug-eluting stent.  He has not been followed by cardiology since.  Patient has had poorly controlled diabetes and has been followed at the [facility] health clinic.  His A1c was 15.0 in June 2025 and he establish care with endocrinology in July, and has since been taking Lantus .  He lives in [place] with his wife.  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  He does not routinely exercise but had not been having chest pain or dyspnea.  Mr. Wiedeman was in his usual state of health until he evening of September 12, when he returned home after having a steak dinner and was sitting on the couch watching TV about an hour later, when he had sudden onset of 7/10 substernal chest heaviness, pressure, and burning.  He said it felt like something was stuck without a piece of steak had not gone down right.  He became diaphoretic but denies dyspnea, nausea, or vomiting.  He took a nitroglycerin , which he notes was several years old and also chewed some Tums without immediate  relief.  He contacted a friend who drove him to the emergency department.    On arrival to the ED, he was afebrile and markedly hypertensive at 237/102.  ECG showed sinus rhythm @ 69 w/ LAD, LAFB, and LAE.  Labs notable for gluc of 299, HsTrop of 19  278, H/H nl @ 16.3/48.7.  CXR w/o active cardiopulmonary disease.  He was provided ASA, heparin , IV metoprolol , and insulin .  Pain resolved within about 2-1/2 hours of onset.  BP 169/86 this AM.  He has not had any recurrent chest  discomfort.  Wife at bedside.   Inpatient Medications   Subjective    [START ON 07/22/2024] aspirin  EC  81 mg Oral Daily   atorvastatin   80 mg Oral Daily   insulin  aspart  0-15 Units Subcutaneous TID WC   insulin  aspart  0-5 Units Subcutaneous QHS   insulin  glargine  15 Units Subcutaneous QHS   lisinopril   40 mg Oral Daily    Family History    Family History  Problem Relation Age of Onset   Diabetes Mother    Heart disease Mother    Arthritis Mother    Alcohol abuse Father    Depression Sister    Lung cancer Brother    Depression Brother    Stomach cancer Brother    Mental illness Brother    Alcohol abuse Brother    Heart attack Brother    Alcohol abuse Brother    He indicated that his mother is deceased. He indicated that his father is deceased. He indicated that his sister is alive. He indicated that two of his four brothers are deceased.   Social History    Social History   Socioeconomic History   Marital status: Divorced    Spouse name: Not on file   Number of children: Not on file   Years of education: Not on file   Highest education level: Not on file  Occupational History   Not on file  Tobacco Use   Smoking status: Former    Current packs/day: 0.00    Types: Cigarettes    Quit date: 10/07/2016    Years since quitting: 7.7   Smokeless tobacco: Never  Vaping Use   Vaping status: Never Used  Substance and Sexual Activity   Alcohol use: No   Drug use: Not Currently   Sexual activity: Yes  Other Topics Concern   Not on file  Social History Narrative   Lives locally with wife.  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   Social Drivers of Corporate investment banker Strain: Low Risk  (05/08/2024)   Received from [facility]   Overall Financial Resource Strain (CARDIA)    Difficulty of Paying Living Expenses: Not hard at all  Food Insecurity: No Food Insecurity  (05/08/2024)   Received from [facility]   Hunger Vital Sign    Within the past 12 months, you worried that your food would run out before you got the money to buy more.: Never true    Within the past 12 months, the food you bought just didn't last and you didn't have money to get more.: Never true  Transportation Needs: No Transportation Needs (05/08/2024)   Received from [facility] - Transportation    In the past 12 months, has lack of transportation kept you from medical appointments or from getting  medications?: No    Lack of Transportation (Non-Medical): No  Physical Activity: Not on file  Stress: Not on file  Social Connections: Not on file  Intimate Partner Violence: Not on file     Review of Systems    General:  No chills, fever, night sweats or weight changes.  Cardiovascular:  +++ chest pain, +++ diaphoresis, no dyspnea on exertion, edema, orthopnea, palpitations, paroxysmal nocturnal dyspnea. Dermatological: No rash, lesions/masses Respiratory: No cough, dyspnea Urologic: No hematuria, dysuria Abdominal:   No nausea, vomiting, diarrhea, bright red blood per rectum, melena, or hematemesis Neurologic:  No visual changes, wkns, changes in mental status. All other systems reviewed and are otherwise negative except as noted above.     Objective   Physical Exam    Blood pressure (!) 169/86, pulse 60, temperature (!) 97.5 F (36.4 C), temperature source Oral, resp. rate 16, height 5' 8 (1.727 m), weight 83 kg, SpO2 99%.  General: Pleasant, NAD Psych: Normal affect. Neuro: Alert and oriented X 3. Moves all extremities spontaneously. HEENT: Normal  Neck: Supple without bruits or JVD. Lungs:  Resp regular and unlabored, CTA. Heart: RRR no s3, s4, or murmurs. Abdomen: Soft, non-tender, non-distended, BS + x 4.  Extremities: No clubbing, cyanosis or edema. DP/PT2+, Radials 2+ and equal bilaterally.  Labs    Cardiac  Enzymes Recent Labs  Lab 07/20/24 2148 07/20/24 2336  TROPONINIHS 19* 278*    Lab Results  Component Value Date   WBC 9.1 07/20/2024   HGB 16.3 07/20/2024   HCT 48.7 07/20/2024   MCV 80.1 07/20/2024   PLT 159 07/20/2024    Recent Labs  Lab 07/20/24 2148  NA 136  K 3.7  CL 96*  CO2 27  BUN 22  CREATININE 0.77  CALCIUM  9.6  GLUCOSE 299*   Lab Results  Component Value Date   CHOL 191 04/10/2024   HDL 44 04/10/2024   LDLCALC 129 (H) 04/10/2024   TRIG 100 04/10/2024     Radiology Studies    DG Chest 2 View Result Date: 07/20/2024 CLINICAL DATA:  Left-sided chest pain for 1 hour, initial encounter EXAM: CHEST - 2 VIEW COMPARISON:  None Available. FINDINGS: The heart size and mediastinal contours are within normal limits. Both lungs are clear. The visualized skeletal structures are unremarkable. IMPRESSION: No active cardiopulmonary disease. Electronically Signed   By: Oneil Devonshire M.D.   On: 07/20/2024 22:06      ECG & Cardiac Imaging    Regular sinus rhythm @ 69 w/ LAD, LAFB, and LAE - personally reviewed.  Assessment & Plan    1.  Non-STEMI/CAD: Patient with prior history of non-STEMI status post OM1 drug-eluting stent placement in September 2016 at [facility].  EF was normal at that time.  He has not been followed by cardiology since.  Patient presented to the emergency department on the evening of September 13 due to midsternal chest pressure and burning associate with diaphoresis.  He was markedly hypertensive on admission with a blood pressure of 237/102.  ECG showed no acute ST or T changes.  Chest pain resolved after approximately 2 to 2-1/2 hours,  And he has had no recurrence.  He was not having chest pain or dyspnea at home prior to this event.  Follow-up troponins this morning.  In light of symptoms, elevated troponin, and high suspicion for progression of coronary disease in the setting of poorly controlled diabetes, we discussed the role of diagnostic catheterization.   The patient understands that risks  include but are not limited to stroke (1 in 1000), death (1 in 1000), kidney failure [usually temporary] (1 in 500), bleeding (1 in 200), allergic reaction [possibly serious] (1 in 200), and agrees to proceed.  Will place on board for tomorrow and reiterated that if he has any any recurrent chest discomfort, that he is to notify nursing staff.  Continue aspirin , high potency statin statin, and heparin  therapy.  Baseline bradycardia prevents initiation of beta-blocker therapy.  Add Nitropaste.  2.  Hypertensive urgency: As above, blood pressure 237/102 on admission.  He received 1 dose of IV metoprolol  overnight blood pressure remains elevated today at 169/86 with a heart rate of 60.  He is on lisinopril  20 mg daily at home and this has been increased to 40 mg here, though he has not received any yet.  In case EF reduced and Entresto potentially appropriate, will change lisinopril  to valsartan.  Resting heart rate in the 50s overnight will preclude initiation of beta-blocker.  Follow blood pressures throughout the day and consider addition of amlodipine.  3.  Hyperlipidemia: LDL was 129 in June, at which time he was statin nave.  Continue atorvastatin  80 mg and plan to follow-up lipids in the outpatient setting.  4.  Poorly controlled type 2 diabetes mellitus: A1c 15.0 in June.  Followed by endocrinology as an outpatient and has been taking Lantus .  Management per medicine team.  5.  ? Sleep apnea: Apneic episodes noted on monitoring overnight.  Will need outpatient sleep study.  Risk Assessment/Risk Scores:     TIMI Risk Score for Unstable Angina or Non-ST Elevation MI:   The patient's TIMI risk score is 4, which indicates a 20% risk of all cause mortality, new or recurrent myocardial infarction or need for urgent revascularization in the next 14 days.      Signed, Lonni Meager, NP 07/21/2024, 8:31 AM  For questions or updates, please contact   Please  consult [email or web address] for contact info under Cardiology/STEMI.

## 2024-07-21 NOTE — Progress Notes (Signed)
 PHARMACY - ANTICOAGULATION CONSULT NOTE  Pharmacy Consult for Heparin   Indication: chest pain/ACS  No Known Allergies  Patient Measurements: Height: 5' 8 (172.7 cm) Weight: 83 kg (183 lb) IBW/kg (Calculated) : 68.4 HEPARIN  DW (KG): 83  Vital Signs: Temp: 98.4 F (36.9 C) (09/13 2142) Temp Source: Oral (09/13 2142) BP: 237/102 (09/13 2144) Pulse Rate: 67 (09/13 2142)  Labs: Recent Labs    07/20/24 2148 07/20/24 2336  HGB 16.3  --   HCT 48.7  --   PLT 159  --   CREATININE 0.77  --   TROPONINIHS 19* 278*    Estimated Creatinine Clearance: 101.8 mL/min (by C-G formula based on SCr of 0.77 mg/dL).   Medical History: Past Medical History:  Diagnosis Date   Anemia    CAD (coronary artery disease)    Diabetes mellitus without complication (HCC)    Elevated lipids    GERD (gastroesophageal reflux disease)    Hypertension     Medications:  (Not in a hospital admission)   Assessment: Pharmacy consulted to dose heparin  in this 63 year old male admitted with ACS/NSTEMI.  No prior anticoag. CrCl = 101.8 ml/min  Goal of Therapy:  Heparin  level 0.3-0.7 units/ml Monitor platelets by anticoagulation protocol: Yes   Plan:  Give 4000 units bolus x 1 Start heparin  infusion at 1150 units/hr Check anti-Xa level in 6 hours and daily while on heparin  Continue to monitor H&H and platelets  Roshanna Cimino D 07/21/2024,12:58 AM

## 2024-07-21 NOTE — Assessment & Plan Note (Signed)
 Continue Lantus  Sliding scale insulin  coverage

## 2024-07-21 NOTE — Progress Notes (Signed)
 PHARMACY - ANTICOAGULATION CONSULT NOTE  Pharmacy Consult for Heparin   Indication: chest pain/ACS  No Known Allergies  Patient Measurements: Height: 5' 8 (172.7 cm) Weight: 83 kg (183 lb) IBW/kg (Calculated) : 68.4 HEPARIN  DW (KG): 83  Vital Signs: Temp: 97.5 F (36.4 C) (09/14 0611) Temp Source: Oral (09/14 0611) BP: 169/86 (09/14 0611) Pulse Rate: 60 (09/14 0611)  Labs: Recent Labs    07/20/24 2148 07/20/24 2336 07/21/24 0057 07/21/24 0557  HGB 16.3  --   --   --   HCT 48.7  --   --   --   PLT 159  --   --   --   LABPROT  --   --  14.4  --   INR  --   --  1.1  --   HEPARINUNFRC  --   --   --  0.47  CREATININE 0.77  --   --   --   TROPONINIHS 19* 278*  --   --     Estimated Creatinine Clearance: 101.8 mL/min (by C-G formula based on SCr of 0.77 mg/dL).   Medical History: Past Medical History:  Diagnosis Date   Anemia    CAD (coronary artery disease)    Diabetes mellitus without complication (HCC)    Elevated lipids    GERD (gastroesophageal reflux disease)    Hypertension     Medications:  (Not in a hospital admission)   Assessment: Pharmacy consulted to dose heparin  in this 63 year old male admitted with ACS/NSTEMI.  No prior anticoag. CrCl = 101.8 ml/min  Goal of Therapy:  Heparin  level 0.3-0.7 units/ml Monitor platelets by anticoagulation protocol: Yes   Plan:  9/14: HL @ 0557 = 0.47, therapeutic X 1  - will continue pt on current rate and recheck HL in 6 hrs - CBC daily   Laurian Edrington D 07/21/2024,7:01 AM

## 2024-07-21 NOTE — Progress Notes (Signed)
 [facility] Progress Note  Patient: Frank Miles    FMW:[id number]  DOA: 07/20/2024     Date of Service: the patient was seen and examined on 07/21/2024  Chief Complaint  Patient presents with   Chest Pain   Brief hospital course:  Frank Miles is a 63 y.o. male with medical history significant for Insulin -dependent type 2 diabetes, HTN, history of NSTEMI 2016 s/p LCx stent, being admitted with NSTEMI.  He presented with sudden onset left-sided chest pain starting an hour prior to arrival.  He states the pain felt like something was stuck in the middle of his chest when he took an antacid.  It was nonradiating.  He had associated diaphoresis without nausea or vomiting or shortness of breath and no lightheadedness or palpitations.  He was previously in his usual state of health. On arrival BP 237/102 with otherwise normal vitals. Labs notable for Troponin 19-->278. Glucose 299 EKG showed NSR at 68 with no ischemic ST-T wave changes Chest x-ray nonacute Patient given chewable aspirin  324 mg, started on a heparin  drip. Admission requested.    Assessment and Plan:  #NSTEMI (non-ST elevated myocardial infarction) (HCC) History of NSTEMI 2016 s/p LCx stent Continue aspirin  and atorvastatin  Continue heparin  infusion NTG sublingual as needed chest pain with morphine for breakthrough Metoprolol  for BP control Cardiology consulted Keep n.p.o. after midnight, cardiac cath will be done tomorrow a.m.    # Hypertensive urgency IV metoprolol  x 1-2 to transition to oral and continue home lisinopril     # Uncontrolled type 2 diabetes mellitus with hyperglycemia, with long-term current use of insulin  (HCC) Continue Lantus  Sliding scale insulin  coverage   Body mass index is 27.83 kg/m.  Interventions:  Diet: Cardiac/carb modified diet DVT Prophylaxis: Heparin  IV infusion  Advance goals of care discussion: Full code  Family Communication: family was not present at  bedside, at the time of interview.  The pt provided permission to discuss medical plan with the family. Opportunity was given to ask question and all questions were answered satisfactorily.   Disposition:  Pt is from Home, admitted with NSTEMI, still on IV hep gtt, needs cardiac cath, scheduled on 9/15, which precludes a safe discharge. Discharge to Home, when stable and cleared by cardiology.  Subjective: No significant events overnight, currently patient is chest pain-free.  Denied any active issues. Patient agreed for cardiac cath tomorrow a.m.  Physical Exam: General: NAD, lying comfortably Appear in no distress, affect appropriate Eyes: PERRLA ENT: Oral Mucosa Clear, moist  Neck: no JVD,  Cardiovascular: S1 and S2 Present, no Murmur,  Respiratory: good respiratory effort, Bilateral Air entry equal and Decreased, no Crackles, no wheezes Abdomen: Bowel Sound present, Soft and no tenderness,  Skin: no rashes Extremities: no Pedal edema, no calf tenderness Neurologic: without any new focal findings Gait not checked due to patient safety concerns  Vitals:   07/21/24 0912 07/21/24 1100 07/21/24 1115 07/21/24 1200  BP:  (!) 153/80  (!) 158/89  Pulse:   (!) 59 (!) 58  Resp:   12 16  Temp: 97.6 F (36.4 C)   98.2 F (36.8 C)  TempSrc: Oral   Oral  SpO2:  98%  99%  Weight:      Height:        Intake/Output Summary (Last 24 hours) at 07/21/2024 1310 Last data filed at 07/21/2024 1145 Gross per 24 hour  Intake --  Output 1225 ml  Net -1225 ml   Filed Weights   07/20/24 2139 07/21/24  0056  Weight: 84.4 kg 83 kg    Data Reviewed: I have personally reviewed and interpreted daily labs, tele strips, imagings as discussed above. I reviewed all nursing notes, pharmacy notes, vitals, pertinent old records I have discussed plan of care as described above with RN and patient/family.  CBC: Recent Labs  Lab 07/20/24 2148  WBC 9.1  HGB 16.3  HCT 48.7  MCV 80.1  PLT 159    Basic Metabolic Panel: Recent Labs  Lab 07/20/24 2148 07/21/24 1000  NA 136  --   K 3.7  --   CL 96*  --   CO2 27  --   GLUCOSE 299*  --   BUN 22  --   CREATININE 0.77  --   CALCIUM  9.6  --   MG  --  2.0    Studies: DG Chest 2 View Result Date: 07/20/2024 CLINICAL DATA:  Left-sided chest pain for 1 hour, initial encounter EXAM: CHEST - 2 VIEW COMPARISON:  None Available. FINDINGS: The heart size and mediastinal contours are within normal limits. Both lungs are clear. The visualized skeletal structures are unremarkable. IMPRESSION: No active cardiopulmonary disease. Electronically Signed   By: Oneil Devonshire M.D.   On: 07/20/2024 22:06    Scheduled Meds:  [START ON 07/22/2024] aspirin  EC  81 mg Oral Daily   atorvastatin   80 mg Oral Daily   insulin  aspart  0-15 Units Subcutaneous TID WC   insulin  aspart  0-5 Units Subcutaneous QHS   insulin  glargine  15 Units Subcutaneous QHS   irbesartan   150 mg Oral Daily   nitroGLYCERIN   1 inch Topical Q6H   Continuous Infusions:  heparin  1,150 Units/hr (07/21/24 0937)   PRN Meds: acetaminophen , hydrALAZINE , nitroGLYCERIN , ondansetron  (ZOFRAN ) IV  Time spent: 35 minutes  Author: ELVAN SOR. MD [facility] 07/21/2024 1:10 PM  To reach On-call, see care teams to locate the attending and reach out to them via [email or web address]. If 7PM-7AM, please contact night-coverage If you still have difficulty reaching the attending provider, please page the [facility] (Director on Call) for [facility] on amion for assistance.

## 2024-07-21 NOTE — Assessment & Plan Note (Addendum)
 History of NSTEMI 2016 s/p LCx stent Continue aspirin  and atorvastatin  Continue heparin  infusion NTG sublingual as needed chest pain with morphine for breakthrough Metoprolol  for BP control Cardiology consulted

## 2024-07-21 NOTE — Assessment & Plan Note (Signed)
 IV metoprolol  x 1-2 to transition to oral and continue home lisinopril 

## 2024-07-22 ENCOUNTER — Inpatient Hospital Stay: Admit: 2024-07-22 | Discharge: 2024-07-22 | Disposition: A | Discharge status: Home / Self Care | Attending: Cardiology

## 2024-07-22 ENCOUNTER — Other Ambulatory Visit: Payer: Self-pay

## 2024-07-22 ENCOUNTER — Encounter
Admission: EM | Disposition: A | Discharge status: Home / Self Care | Payer: Self-pay | Source: Home / Self Care | Attending: Student

## 2024-07-22 DIAGNOSIS — I214 Non-ST elevation (NSTEMI) myocardial infarction: Secondary | ICD-10-CM

## 2024-07-22 DIAGNOSIS — I110 Hypertensive heart disease with heart failure: Secondary | ICD-10-CM | POA: Diagnosis not present

## 2024-07-22 DIAGNOSIS — I5030 Unspecified diastolic (congestive) heart failure: Secondary | ICD-10-CM

## 2024-07-22 DIAGNOSIS — I2510 Atherosclerotic heart disease of native coronary artery without angina pectoris: Secondary | ICD-10-CM | POA: Diagnosis not present

## 2024-07-22 HISTORY — PX: CORONARY IMAGING/OCT: CATH118326

## 2024-07-22 HISTORY — PX: LEFT HEART CATH AND CORONARY ANGIOGRAPHY: CATH118249

## 2024-07-22 HISTORY — PX: CORONARY STENT INTERVENTION: CATH118234

## 2024-07-22 LAB — ECHOCARDIOGRAM COMPLETE
AR max vel: 2.13 cm2
AV Area VTI: 2.28 cm2
AV Area mean vel: 2.03 cm2
AV Mean grad: 4 mmHg
AV Peak grad: 6.8 mmHg
Ao pk vel: 1.3 m/s
Area-P 1/2: 2.5 cm2
Calc EF: 54.5 %
Height: 68 in
MV VTI: 1.95 cm2
S' Lateral: 3.2 cm
Single Plane A2C EF: 46 %
Single Plane A4C EF: 61.7 %
Weight: 2928 [oz_av]

## 2024-07-22 LAB — CBC
HCT: 47.3 % (ref 39.0–52.0)
Hemoglobin: 15.5 g/dL (ref 13.0–17.0)
MCH: 26.5 pg (ref 26.0–34.0)
MCHC: 32.8 g/dL (ref 30.0–36.0)
MCV: 80.9 fL (ref 80.0–100.0)
Platelets: 152 K/uL (ref 150–400)
RBC: 5.85 MIL/uL — ABNORMAL HIGH (ref 4.22–5.81)
RDW: 13.9 % (ref 11.5–15.5)
WBC: 7.5 K/uL (ref 4.0–10.5)
nRBC: 0 % (ref 0.0–0.2)

## 2024-07-22 LAB — HEPARIN LEVEL (UNFRACTIONATED): Heparin Unfractionated: 0.57 [IU]/mL (ref 0.30–0.70)

## 2024-07-22 LAB — LIPID PANEL
Cholesterol: 179 mg/dL (ref 0–200)
HDL: 48 mg/dL (ref >40)
LDL Cholesterol: 114 mg/dL — ABNORMAL HIGH (ref 0–99)
Total CHOL/HDL Ratio: 3.7 ratio
Triglycerides: 85 mg/dL (ref <150)
VLDL: 17 mg/dL (ref 0–40)

## 2024-07-22 LAB — HEMOGLOBIN A1C
Hgb A1c MFr Bld: 10.1 % — ABNORMAL HIGH (ref 4.8–5.6)
Mean Plasma Glucose: 243.17 mg/dL

## 2024-07-22 LAB — BASIC METABOLIC PANEL WITH GFR
Anion gap: 9 (ref 5–15)
BUN: 16 mg/dL (ref 8–23)
CO2: 30 mmol/L (ref 22–32)
Calcium: 8.9 mg/dL (ref 8.9–10.3)
Chloride: 97 mmol/L — ABNORMAL LOW (ref 98–111)
Creatinine, Ser: 0.75 mg/dL (ref 0.61–1.24)
GFR, Estimated: >60 mL/min (ref >60)
Glucose, Bld: 174 mg/dL — ABNORMAL HIGH (ref 70–99)
Potassium: 3.5 mmol/L (ref 3.5–5.1)
Sodium: 136 mmol/L (ref 135–145)

## 2024-07-22 LAB — PHOSPHORUS: Phosphorus: 3.6 mg/dL (ref 2.5–4.6)

## 2024-07-22 LAB — MAGNESIUM: Magnesium: 2 mg/dL (ref 1.7–2.4)

## 2024-07-22 LAB — POCT ACTIVATED CLOTTING TIME
Activated Clotting Time: 400 s
Activated Clotting Time: 412 s

## 2024-07-22 LAB — GLUCOSE, CAPILLARY
Glucose-Capillary: 147 mg/dL — ABNORMAL HIGH (ref 70–99)
Glucose-Capillary: 274 mg/dL — ABNORMAL HIGH (ref 70–99)
Glucose-Capillary: 189 mg/dL — ABNORMAL HIGH (ref 70–99)

## 2024-07-22 LAB — CBG MONITORING, ED: Glucose-Capillary: 169 mg/dL — ABNORMAL HIGH (ref 70–99)

## 2024-07-22 SURGERY — LEFT HEART CATH AND CORONARY ANGIOGRAPHY
Anesthesia: Moderate Sedation

## 2024-07-22 MED ORDER — HYDRALAZINE HCL 20 MG/ML IJ SOLN
INTRAMUSCULAR | Status: AC
  Filled 2024-07-22: qty 1

## 2024-07-22 MED ORDER — PERFLUTREN LIPID MICROSPHERE
1.0000 mL | INTRAVENOUS | Status: AC | PRN
  Administered 2024-07-22: 2 mL via INTRAVENOUS

## 2024-07-22 MED ORDER — PRASUGREL HCL 10 MG PO TABS
ORAL_TABLET | ORAL | Status: DC | PRN
  Administered 2024-07-22: 60 mg via ORAL

## 2024-07-22 MED ORDER — PRASUGREL HCL 10 MG PO TABS
ORAL_TABLET | ORAL | Status: AC
  Filled 2024-07-22: qty 6

## 2024-07-22 MED ORDER — MIDAZOLAM HCL 2 MG/2ML IJ SOLN
INTRAMUSCULAR | Status: DC | PRN
  Administered 2024-07-22 (×2): 1 mg via INTRAVENOUS

## 2024-07-22 MED ORDER — IOHEXOL 300 MG/ML  SOLN
INTRAMUSCULAR | Status: DC | PRN
  Administered 2024-07-22: 160 mL

## 2024-07-22 MED ORDER — IRBESARTAN 150 MG PO TABS
300.0000 mg | ORAL_TABLET | Freq: Every day | ORAL | Status: DC
  Administered 2024-07-22: 300 mg via ORAL
  Filled 2024-07-22 (×3): qty 1
  Filled 2024-07-22: qty 2

## 2024-07-22 MED ORDER — VERAPAMIL HCL 2.5 MG/ML IV SOLN
INTRAVENOUS | Status: AC
Start: 2024-07-22 — End: 2024-07-22
  Filled 2024-07-22: qty 2

## 2024-07-22 MED ORDER — SODIUM CHLORIDE 0.9% FLUSH: 3.0000 mL | INTRAVENOUS | Status: DC | PRN

## 2024-07-22 MED ORDER — PRASUGREL HCL 10 MG PO TABS
10.0000 mg | ORAL_TABLET | Freq: Every day | ORAL | Status: DC
  Administered 2024-07-23: 10 mg via ORAL
  Filled 2024-07-22: qty 1

## 2024-07-22 MED ORDER — SODIUM CHLORIDE 0.9 % IV SOLN: 250.0000 mL | INTRAVENOUS | Status: AC | PRN

## 2024-07-22 MED ORDER — LIDOCAINE HCL (PF) 1 % IJ SOLN
INTRAMUSCULAR | Status: DC | PRN
  Administered 2024-07-22: 2 mL

## 2024-07-22 MED ORDER — HEPARIN SODIUM (PORCINE) 1000 UNIT/ML IJ SOLN
INTRAMUSCULAR | Status: DC | PRN
  Administered 2024-07-22: 4500 [IU] via INTRAVENOUS
  Administered 2024-07-22: 4000 [IU] via INTRAVENOUS

## 2024-07-22 MED ORDER — MIDAZOLAM HCL 2 MG/2ML IJ SOLN
INTRAMUSCULAR | Status: AC
  Filled 2024-07-22: qty 2

## 2024-07-22 MED ORDER — FENTANYL CITRATE (PF) 100 MCG/2ML IJ SOLN
INTRAMUSCULAR | Status: DC | PRN
  Administered 2024-07-22: 50 ug via INTRAVENOUS
  Administered 2024-07-22: 25 ug via INTRAVENOUS

## 2024-07-22 MED ORDER — FREE WATER
500.0000 mL | Freq: Once | Status: AC
  Administered 2024-07-22: 500 mL via ORAL

## 2024-07-22 MED ORDER — NITROGLYCERIN 1 MG/10 ML FOR IR/CATH LAB
INTRA_ARTERIAL | Status: DC | PRN
  Administered 2024-07-22 (×2): 200 ug via INTRACORONARY

## 2024-07-22 MED ORDER — HEPARIN (PORCINE) IN NACL 1000-0.9 UT/500ML-% IV SOLN
INTRAVENOUS | Status: AC
  Filled 2024-07-22: qty 1000

## 2024-07-22 MED ORDER — FENTANYL CITRATE (PF) 100 MCG/2ML IJ SOLN
INTRAMUSCULAR | Status: AC
  Filled 2024-07-22: qty 2

## 2024-07-22 MED ORDER — HEPARIN SODIUM (PORCINE) 1000 UNIT/ML IJ SOLN
INTRAMUSCULAR | Status: AC
  Filled 2024-07-22: qty 10

## 2024-07-22 MED ORDER — CARVEDILOL 3.125 MG PO TABS
3.1250 mg | ORAL_TABLET | Freq: Two times a day (BID) | ORAL | Status: DC
  Administered 2024-07-23: 3.125 mg via ORAL
  Filled 2024-07-22: qty 1

## 2024-07-22 MED ORDER — CARVEDILOL 3.125 MG PO TABS
3.1250 mg | ORAL_TABLET | Freq: Two times a day (BID) | ORAL | Status: DC
  Administered 2024-07-22: 3.125 mg via ORAL
  Filled 2024-07-22 (×3): qty 1

## 2024-07-22 MED ORDER — SODIUM CHLORIDE 0.9% FLUSH
3.0000 mL | Freq: Two times a day (BID) | INTRAVENOUS | Status: DC
Start: 2024-07-22 — End: 2024-07-23
  Administered 2024-07-22 – 2024-07-23 (×2): 3 mL via INTRAVENOUS

## 2024-07-22 MED ORDER — HEPARIN (PORCINE) IN NACL 1000-0.9 UT/500ML-% IV SOLN
INTRAVENOUS | Status: DC | PRN
  Administered 2024-07-22: 1000 mL

## 2024-07-22 MED ORDER — VERAPAMIL HCL 2.5 MG/ML IV SOLN
INTRAVENOUS | Status: DC | PRN
  Administered 2024-07-22: 2.5 mg via INTRA_ARTERIAL

## 2024-07-22 MED ORDER — HYDRALAZINE HCL 20 MG/ML IJ SOLN
INTRAMUSCULAR | Status: DC | PRN
  Administered 2024-07-22 (×2): 10 mg via INTRAVENOUS

## 2024-07-22 SURGICAL SUPPLY — 18 items
BALLOON TREK RX 2.5X15 (BALLOONS) IMPLANT
BALLOON ~~LOC~~ TREK NEO RX 3.0X20 (BALLOONS) IMPLANT
CATH DRAGONFLY OPSTAR (CATHETERS) IMPLANT
CATH INFINITI AMBI 5FR JK (CATHETERS) IMPLANT
CATH LAUNCHER 6FR JR4 (CATHETERS) IMPLANT
DEVICE RAD TR BAND REGULAR (VASCULAR PRODUCTS) IMPLANT
DRAPE BRACHIAL (DRAPES) IMPLANT
GLIDESHEATH SLEND SS 6F .021 (SHEATH) IMPLANT
GUIDEWIRE INQWIRE 1.5J.035X260 (WIRE) IMPLANT
KIT ENCORE 26 ADVANTAGE (KITS) IMPLANT
PACK CARDIAC CATH (CUSTOM PROCEDURE TRAY) ×1 IMPLANT
SET ATX-X65L (MISCELLANEOUS) IMPLANT
STATION PROTECTION PRESSURIZED (MISCELLANEOUS) IMPLANT
STENT ONYX FRONTIER 2.75X38 (Permanent Stent) IMPLANT
STENT ONYX FRONTIER 3.0X38 (Permanent Stent) IMPLANT
STENT ONYX FRONTIER 3.5X12 (Permanent Stent) IMPLANT
TUBING CIL FLEX 10 FLL-RA (TUBING) IMPLANT
WIRE RUNTHROUGH IZANAI 014 180 (WIRE) IMPLANT

## 2024-07-22 NOTE — Interval H&P Note (Signed)
 History and Physical Interval Note:  07/22/2024 11:11 AM  Frank Miles  has presented today for surgery, with the diagnosis of nstemi.  The various methods of treatment have been discussed with the patient and family. After consideration of risks, benefits and other options for treatment, the patient has consented to  Procedure(s): LEFT HEART CATH AND CORONARY ANGIOGRAPHY (N/A) as a surgical intervention.  The patient's history has been reviewed, patient examined, no change in status, stable for surgery.  I have reviewed the patient's chart and labs.  Questions were answered to the patient's satisfaction.     Paizlie Klaus

## 2024-07-22 NOTE — Progress Notes (Signed)
 Rounding Note   Patient Name: Frank Miles Date of Encounter: 07/22/2024  [facility] Cardiologist: None   Subjective Troponin 9333. Patient denies any recurrence of chest pain since admission. Plan for cardiac catheterization later this morning.   Scheduled Meds:  aspirin  EC  81 mg Oral Daily   atorvastatin   80 mg Oral Daily   free water   500 mL Oral Once   insulin  aspart  0-15 Units Subcutaneous TID WC   insulin  aspart  0-5 Units Subcutaneous QHS   insulin  glargine  15 Units Subcutaneous QHS   irbesartan   150 mg Oral Daily   nitroGLYCERIN   1 inch Topical Q6H   Continuous Infusions:  heparin  1,300 Units/hr (07/21/24 1815)   PRN Meds: acetaminophen , hydrALAZINE , nitroGLYCERIN , ondansetron  (ZOFRAN ) IV   Vital Signs  Vitals:   07/22/24 0358 07/22/24 0700 07/22/24 0730 07/22/24 0755  BP: 131/77 (!) 139/104 (!) 143/89   Pulse: (!) 56 (!) 59 (!) 59   Resp: 16 16 18    Temp: 97.9 F (36.6 C)   98 F (36.7 C)  TempSrc: Oral   Oral  SpO2: 98% 96% 98%   Weight:      Height:        Intake/Output Summary (Last 24 hours) at 07/22/2024 0942 Last data filed at 07/21/2024 1145 Gross per 24 hour  Intake --  Output 250 ml  Net -250 ml      07/21/2024   12:56 AM 07/20/2024    9:39 PM 04/17/2024    9:13 AM  Last 3 Weights  Weight (lbs) 183 lb 186 lb 1.1 oz 186 lb  Weight (kg) 83.008 kg 84.4 kg 84.369 kg      Telemetry Sinus rhythm with PACs/PVCs - Personally Reviewed  Physical Exam  GEN: No acute distress.   Neck: No JVD Cardiac: RRR, no murmurs, rubs, or gallops.  Respiratory: Clear to auscultation bilaterally. GI: Soft, nontender, non-distended  MS: No edema; No deformity. Neuro:  Nonfocal  Psych: Normal affect   Labs High Sensitivity Troponin:   Recent Labs  Lab 07/20/24 2148 07/20/24 2336 07/21/24 1000  TROPONINIHS 19* 278* 9,333*     Chemistry Recent Labs  Lab 07/20/24 2148 07/21/24 1000 07/22/24 0434  NA 136  --  136  K 3.7   --  3.5  CL 96*  --  97*  CO2 27  --  30  GLUCOSE 299*  --  174*  BUN 22  --  16  CREATININE 0.77  --  0.75  CALCIUM  9.6  --  8.9  MG  --  2.0 2.0  GFRNONAA >60  --  >60  ANIONGAP 13  --  9    Lipids  Recent Labs  Lab 07/22/24 0434  CHOL 179  TRIG 85  HDL 48  LDLCALC 114*  CHOLHDL 3.7    Hematology Recent Labs  Lab 07/20/24 2148 07/22/24 0434  WBC 9.1 7.5  RBC 6.08* 5.85*  HGB 16.3 15.5  HCT 48.7 47.3  MCV 80.1 80.9  MCH 26.8 26.5  MCHC 33.5 32.8  RDW 13.9 13.9  PLT 159 152   Thyroid No results for input(s): TSH, FREET4 in the last 168 hours.  BNPNo results for input(s): BNP, PROBNP in the last 168 hours.  DDimer No results for input(s): DDIMER in the last 168 hours.   Radiology  DG Chest 2 View Result Date: 07/20/2024 CLINICAL DATA:  Left-sided chest pain for 1 hour, initial encounter EXAM: CHEST - 2 VIEW COMPARISON:  None  Available. FINDINGS: The heart size and mediastinal contours are within normal limits. Both lungs are clear. The visualized skeletal structures are unremarkable. IMPRESSION: No active cardiopulmonary disease. Electronically Signed   By: Oneil Devonshire M.D.   On: 07/20/2024 22:06    Cardiac Studies  07/2015 Cardiac cath [facility]) 1. Coronary artery disease including 95% stenosis in proximal part of OM1,  diffusely diseased RCA with long lesion up 50% extending from proximal to  mid part of the vessel.  2. Elevated left ventricular filling pressures (LVEDP = 27 mm Hg).  3. Successful Percutaneous Coronary Intervention to LCx/OM1 with placement  of 3.5x28 mm Promus DES with excellent angiographic result and TIMI 3  flow.   07/2015 Echo complete [facility])  Technically difficult study due to chest wall/lung interference   Left ventricular hypertrophy - moderate   Normal left ventricular systolic function, ejection fraction 65 to 70%   Normal right ventricular systolic function   No significant valvular abnormalities   Patient Profile    63 y.o. male  with a history of CAD status post non-STEMI and prior OM1 stenting in September 2021, LVH, hypertension, hyperlipidemia, poorly controlled diabetes (A1c is 15.0 in June 2025), GERD, and neuropathy, who is being seen for the ongoing evaluation and management of NSTEMI.   Assessment & Plan   Coronary artery disease s/p DES in 2016 NSTEMI - Presenting with midsternal chest pressure and burning with diaphoresis. Markedly hypertensive on admission. - EKG without acute ischemic changes - Troponin [phone] - Started on IV heparin  - Echo complete, pending read - Plan for cardiac catheterization later this morning with further recommendations pending results - Continue nitropaste, ASA and statin therapy  - No BB given baseline bradycardia  Hypertensive urgency - BP 237/102 on arrival  - PTA ACEi transitioned to irbesartan  in case of reduced EF and indication to transition to ARNI - BP improving, consider addition of amlodipine if pressures remain elevated  Hyperlipidemia - LDL 114, not on statin therapy PTA - Continue atorvastatin  80 mg and plan for recheck lipid panel as outpatient  T2DM - A1C 15.0 on admission, recently established with endocrinology as outpatient - Ongoing management per IM  ?Sleep apnea - Apneic episodes noted overnight - Recommend outpatient sleep study  Informed Consent   Shared Decision Making/Informed Consent The risks [stroke (1 in 1000), death (1 in 1000), kidney failure [usually temporary] (1 in 500), bleeding (1 in 200), allergic reaction [possibly serious] (1 in 200)], benefits (diagnostic support and management of coronary artery disease) and alternatives of a cardiac catheterization were discussed in detail with Mr. Godlewski and he is willing to proceed.     For questions or updates, please contact [facility] Please consult [email or web address] for contact info under       Signed, Lesley LITTIE Maffucci, PA-C  07/22/2024, 9:42 AM

## 2024-07-22 NOTE — Progress Notes (Signed)
 [facility] Progress Note  Patient: Frank Miles    FMW:[id number]  DOA: 07/20/2024     Date of Service: the patient was seen and examined on 07/22/2024  Chief Complaint  Patient presents with   Chest Pain   Brief hospital course:  General Wearing is a 63 y.o. male with medical history significant for Insulin -dependent type 2 diabetes, HTN, history of NSTEMI 2016 s/p LCx stent, being admitted with NSTEMI.  He presented with sudden onset left-sided chest pain starting an hour prior to arrival.  He states the pain felt like something was stuck in the middle of his chest when he took an antacid.  It was nonradiating.  He had associated diaphoresis without nausea or vomiting or shortness of breath and no lightheadedness or palpitations.  He was previously in his usual state of health. On arrival BP 237/102 with otherwise normal vitals. Labs notable for Troponin 19-->278. Glucose 299 EKG showed NSR at 68 with no ischemic ST-T wave changes Chest x-ray nonacute Patient given chewable aspirin  324 mg, started on a heparin  drip. Admission requested.    Assessment and Plan:  #NSTEMI (non-ST elevated myocardial infarction) (HCC) History of NSTEMI 2016 s/p LCx stent Continue aspirin  and atorvastatin  Continue heparin  infusion NTG sublingual as needed chest pain with morphine for breakthrough Metoprolol  for BP control Cardiology consulted, patient is n.p.o. since midnight for cardiac cath today   # Hypertensive urgency IV metoprolol  x 1-2 to transition to oral and continue home lisinopril     # Uncontrolled type 2 diabetes mellitus with hyperglycemia, with long-term current use of insulin  (HCC) Continue Lantus  Sliding scale insulin  coverage   Body mass index is 27.83 kg/m.  Interventions:  Diet: Cardiac/carb modified diet DVT Prophylaxis: Heparin  IV infusion  Advance goals of care discussion: Full code  Family Communication: family was not present at bedside, at  the time of interview.  The pt provided permission to discuss medical plan with the family. Opportunity was given to ask question and all questions were answered satisfactorily.   Disposition:  Pt is from Home, admitted with NSTEMI, still on IV hep gtt, needs cardiac cath, scheduled on 9/15, which precludes a safe discharge. Discharge to Home, when stable and cleared by cardiology.  Most likely discharge tomorrow a.m.  Subjective: No significant events overnight, currently patient is chest pain-free.  Denied any active issues. Patient was awaiting for cardiac catheter today  Physical Exam: General: NAD, lying comfortably Appear in no distress, affect appropriate Eyes: PERRLA ENT: Oral Mucosa Clear, moist  Neck: no JVD,  Cardiovascular: S1 and S2 Present, no Murmur,  Respiratory: good respiratory effort, Bilateral Air entry equal and Decreased, no Crackles, no wheezes Abdomen: Bowel Sound present, Soft and no tenderness,  Skin: no rashes Extremities: no Pedal edema, no calf tenderness Neurologic: without any new focal findings Gait not checked due to patient safety concerns  Vitals:   07/22/24 0730 07/22/24 0755 07/22/24 1000 07/22/24 1043  BP: (!) 143/89  (!) 150/89 (!) 167/89  Pulse: (!) 59  60 66  Resp: 18  12   Temp:  98 F (36.7 C)  97.9 F (36.6 C)  TempSrc:  Oral  Temporal  SpO2: 98%  97% 92%  Weight:      Height:        Intake/Output Summary (Last 24 hours) at 07/22/2024 1156 Last data filed at 07/22/2024 0800 Gross per 24 hour  Intake 500 ml  Output --  Net 500 ml   [employer]  07/20/24 2139 07/21/24 0056  Weight: 84.4 kg 83 kg    Data Reviewed: I have personally reviewed and interpreted daily labs, tele strips, imagings as discussed above. I reviewed all nursing notes, pharmacy notes, vitals, pertinent old records I have discussed plan of care as described above with RN and patient/family.  CBC: Recent Labs  Lab 07/20/24 2148 07/22/24 0434  WBC  9.1 7.5  HGB 16.3 15.5  HCT 48.7 47.3  MCV 80.1 80.9  PLT 159 152   Basic Metabolic Panel: Recent Labs  Lab 07/20/24 2148 07/21/24 1000 07/22/24 0434  NA 136  --  136  K 3.7  --  3.5  CL 96*  --  97*  CO2 27  --  30  GLUCOSE 299*  --  174*  BUN 22  --  16  CREATININE 0.77  --  0.75  CALCIUM  9.6  --  8.9  MG  --  2.0 2.0  PHOS  --   --  3.6    Studies: ECHOCARDIOGRAM COMPLETE Result Date: 07/22/2024    ECHOCARDIOGRAM REPORT   Patient Name:   Frank Miles Date of Exam: 07/22/2024 Medical Rec #:  [id number]             Height:       68.0 in Accession #:    [id number]            Weight:       183.0 lb Date of Birth:  [birth year]             BSA:          1.968 m Patient Age:    62 years              BP:           131/77 mmHg Patient Gender: M                     HR:           58 bpm. Exam Location:  [facility] Procedure: 2D Echo, Cardiac Doppler, Color Doppler, Strain Analysis and            Intracardiac Opacification Agent (Both Spectral and Color Flow            Doppler were utilized during procedure). Indications:     NSTEMI I21.4  History:         Patient has no prior history of Echocardiogram examinations.  Sonographer:     Rosina Dunk Referring Phys:  [id number] BRIDGETTE CHRISTOPHER Diagnosing Phys: Deatrice Cage MD  Sonographer Comments: Global longitudinal strain was attempted. IMPRESSIONS  1. Left ventricular ejection fraction, by estimation, is 50 to 55%. The left ventricle has low normal function. The left ventricle has no regional wall motion abnormalities. There is moderate left ventricular hypertrophy. Left ventricular diastolic parameters are consistent with Grade I diastolic dysfunction (impaired relaxation).  2. Right ventricular systolic function is normal. The right ventricular size is normal.  3. The mitral valve is normal in structure. Trivial mitral valve regurgitation. No evidence of mitral stenosis.  4. The aortic valve is normal in structure. Aortic valve  regurgitation is not visualized. No aortic stenosis is present.  5. The inferior vena cava is normal in size with greater than 50% respiratory variability, suggesting right atrial pressure of 3 mmHg. FINDINGS  Left Ventricle: Left ventricular ejection fraction, by estimation, is 50 to 55%. The left ventricle has low normal function. The left ventricle has no regional  wall motion abnormalities. Definity  contrast agent was given IV to delineate the left ventricular endocardial borders. Global longitudinal strain performed but not reported based on interpreter judgement due to suboptimal tracking. The left ventricular internal cavity size was normal in size. There is moderate left ventricular hypertrophy. Left ventricular diastolic parameters are consistent with Grade I diastolic dysfunction (impaired relaxation). Right Ventricle: The right ventricular size is normal. No increase in right ventricular wall thickness. Right ventricular systolic function is normal. Left Atrium: Left atrial size was normal in size. Right Atrium: Right atrial size was normal in size. Pericardium: There is no evidence of pericardial effusion. Mitral Valve: The mitral valve is normal in structure. There is mild thickening of the mitral valve leaflet(s). Trivial mitral valve regurgitation. No evidence of mitral valve stenosis. MV peak gradient, 3.4 mmHg. The mean mitral valve gradient is 1.0 mmHg. Tricuspid Valve: The tricuspid valve is normal in structure. Tricuspid valve regurgitation is not demonstrated. No evidence of tricuspid stenosis. Aortic Valve: The aortic valve is normal in structure. Aortic valve regurgitation is not visualized. No aortic stenosis is present. Aortic valve mean gradient measures 4.0 mmHg. Aortic valve peak gradient measures 6.8 mmHg. Aortic valve area, by VTI measures 2.28 cm. Pulmonic Valve: The pulmonic valve was normal in structure. Pulmonic valve regurgitation is trivial. No evidence of pulmonic stenosis.  Aorta: The aortic root is normal in size and structure. Venous: The inferior vena cava is normal in size with greater than 50% respiratory variability, suggesting right atrial pressure of 3 mmHg. IAS/Shunts: No atrial level shunt detected by color flow Doppler.  LEFT VENTRICLE PLAX 2D LVIDd:         4.70 cm      Diastology LVIDs:         3.20 cm      LV e' medial:    4.13 cm/s LV PW:         1.30 cm      LV E/e' medial:  12.8 LV IVS:        1.30 cm      LV e' lateral:   5.78 cm/s LVOT diam:     2.10 cm      LV E/e' lateral: 9.2 LV SV:         58 LV SV Index:   29 LVOT Area:     3.46 cm  LV Volumes (MOD) LV vol d, MOD A2C: 43.0 ml LV vol d, MOD A4C: 121.0 ml LV vol s, MOD A2C: 23.2 ml LV vol s, MOD A4C: 46.3 ml LV SV MOD A2C:     19.8 ml LV SV MOD A4C:     121.0 ml LV SV MOD BP:      40.9 ml RIGHT VENTRICLE RV Basal diam:  3.50 cm RV Mid diam:    2.30 cm RV S prime:     12.60 cm/s TAPSE (M-mode): 3.0 cm LEFT ATRIUM             Index        RIGHT ATRIUM           Index LA diam:        3.70 cm 1.88 cm/m   RA Area:     15.40 cm LA Vol (A2C):   69.9 ml 35.52 ml/m  RA Volume:   39.10 ml  19.87 ml/m LA Vol (A4C):   50.0 ml 25.41 ml/m LA Biplane Vol: 59.9 ml 30.44 ml/m  AORTIC VALVE  PULMONIC VALVE AV Area (Vmax):    2.13 cm     PV Vmax:        0.86 m/s AV Area (Vmean):   2.03 cm     PV Vmean:       60.600 cm/s AV Area (VTI):     2.28 cm     PV VTI:         0.193 m AV Vmax:           130.00 cm/s  PV Peak grad:   3.0 mmHg AV Vmean:          87.300 cm/s  PV Mean grad:   2.0 mmHg AV VTI:            0.254 m      RVOT Peak grad: 2 mmHg AV Peak Grad:      6.8 mmHg AV Mean Grad:      4.0 mmHg LVOT Vmax:         79.80 cm/s LVOT Vmean:        51.100 cm/s LVOT VTI:          0.167 m LVOT/AV VTI ratio: 0.66  AORTA Ao Root diam: 3.00 cm Ao Asc diam:  2.70 cm MITRAL VALVE MV Area (PHT): 2.50 cm    SHUNTS MV Area VTI:   1.95 cm    Systemic VTI:  0.17 m MV Peak grad:  3.4 mmHg    Systemic Diam: 2.10 cm MV Mean  grad:  1.0 mmHg    Pulmonic VTI:  0.177 m MV Vmax:       0.92 m/s MV Vmean:      49.7 cm/s MV Decel Time: 303 msec MV E velocity: 52.90 cm/s MV A velocity: 86.50 cm/s MV E/A ratio:  0.61 Deatrice Cage MD Electronically signed by Deatrice Cage MD Signature Date/Time: 07/22/2024/10:55:37 AM    Final     Scheduled Meds:  [MAR Hold] aspirin  EC  81 mg Oral Daily   [MAR Hold] atorvastatin   80 mg Oral Daily   [MAR Hold] insulin  aspart  0-15 Units Subcutaneous TID WC   [MAR Hold] insulin  aspart  0-5 Units Subcutaneous QHS   [MAR Hold] insulin  glargine  15 Units Subcutaneous QHS   [MAR Hold] irbesartan   150 mg Oral Daily   [MAR Hold] nitroGLYCERIN   1 inch Topical Q6H   Continuous Infusions:  heparin  Stopped (07/22/24 1046)   PRN Meds: [MAR Hold] acetaminophen , fentaNYL , Heparin  (Porcine) in NaCl, heparin  sodium (porcine), [MAR Hold] hydrALAZINE , lidocaine  (PF), midazolam , [MAR Hold] nitroGLYCERIN , nitroGLYCERIN , [MAR Hold] ondansetron  (ZOFRAN ) IV, prasugrel , verapamil   Time spent: 35 minutes  Author: ELVAN SOR. MD [facility] 07/22/2024 11:56 AM  To reach On-call, see care teams to locate the attending and reach out to them via [email or web address]. If 7PM-7AM, please contact night-coverage If you still have difficulty reaching the attending provider, please page the [facility] (Director on Call) for [facility] on amion for assistance.

## 2024-07-22 NOTE — Progress Notes (Signed)
 PHARMACY - ANTICOAGULATION CONSULT NOTE  Pharmacy Consult for Heparin   Indication: chest pain/ACS  No Known Allergies  Patient Measurements: Height: 5' 8 (172.7 cm) Weight: 83 kg (183 lb) IBW/kg (Calculated) : 68.4 HEPARIN  DW (KG): 83  Vital Signs: Temp: 97.9 F (36.6 C) (09/14 2007) Temp Source: Oral (09/14 2007) BP: 141/86 (09/15 0002) Pulse Rate: 54 (09/15 0002)  Labs: Recent Labs    07/20/24 2148 07/20/24 2336 07/21/24 0057 07/21/24 0557 07/21/24 1000 07/21/24 1441 07/21/24 2256  HGB 16.3  --   --   --   --   --   --   HCT 48.7  --   --   --   --   --   --   PLT 159  --   --   --   --   --   --   LABPROT  --   --  14.4  --   --   --   --   INR  --   --  1.1  --   --   --   --   HEPARINUNFRC  --   --   --  0.47  --  0.28* 0.51  CREATININE 0.77  --   --   --   --   --   --   TROPONINIHS 19* 278*  --   --  9,333*  --   --     Estimated Creatinine Clearance: 101.8 mL/min (by C-G formula based on SCr of 0.77 mg/dL).   Medical History: Past Medical History:  Diagnosis Date   Anemia    CAD (coronary artery disease)    a. 07/2015 NSTEMI/PCI [facility]): LM nl, LAD mild diff dzs up to 25%, D1-6 nl, LCX min irregs, OM1 95 (3.5x28 Promus DES), RCA 50p/m, RPDA/PL nl.   GERD (gastroesophageal reflux disease)    Hyperlipidemia LDL goal <55    Hypertension    LVH (left ventricular hypertrophy)    a. 07/2015 Echo: EF 65-70%, mod LVH, nl RV fxn.   Neuropathy    Type 2 diabetes mellitus (HCC)    a. 04/2024 HbA1c 15.0.    Medications:  (Not in a hospital admission)   Assessment: Pharmacy consulted to dose heparin  in this 63 year old male admitted with ACS/NSTEMI.  No prior anticoag. CrCl = 101.8 ml/min  Goal of Therapy:  Heparin  level 0.3-0.7 units/ml Monitor platelets by anticoagulation protocol: Yes  9/14: HL @ 0557 = 0.47, therapeutic X 1 9/14: HL @ 1441 = 0.28, SUBtherapeutic 9/14: HL @ 2256 = 0.51, therapeutic X 1    Plan:  9/14: HL @ 2256 = 0.51, therapeutic  X 1 - will continue pt on current rate and recheck HL in  6 hrs Continue to monitor CBC daily while on heparin  infusion.   Sugey Trevathan D Clinical Pharmacist 07/22/2024 12:23 AM

## 2024-07-22 NOTE — Progress Notes (Signed)
 PHARMACY - ANTICOAGULATION CONSULT NOTE  Pharmacy Consult for Heparin   Indication: chest pain/ACS  No Known Allergies  Patient Measurements: Height: 5' 8 (172.7 cm) Weight: 83 kg (183 lb) IBW/kg (Calculated) : 68.4 HEPARIN  DW (KG): 83  Vital Signs: Temp: 97.9 F (36.6 C) (09/15 0358) Temp Source: Oral (09/15 0358) BP: 131/77 (09/15 0358) Pulse Rate: 56 (09/15 0358)  Labs: Recent Labs    07/20/24 2148 07/20/24 2336 07/21/24 0057 07/21/24 0557 07/21/24 1000 07/21/24 1441 07/21/24 2256 07/22/24 0434  HGB 16.3  --   --   --   --   --   --   --   HCT 48.7  --   --   --   --   --   --   --   PLT 159  --   --   --   --   --   --   --   LABPROT  --   --  14.4  --   --   --   --   --   INR  --   --  1.1  --   --   --   --   --   HEPARINUNFRC  --   --   --    < >  --  0.28* 0.51 0.57  CREATININE 0.77  --   --   --   --   --   --   --   TROPONINIHS 19* 278*  --   --  9,333*  --   --   --    < > = values in this interval not displayed.    Estimated Creatinine Clearance: 101.8 mL/min (by C-G formula based on SCr of 0.77 mg/dL).   Medical History: Past Medical History:  Diagnosis Date   Anemia    CAD (coronary artery disease)    a. 07/2015 NSTEMI/PCI [facility]): LM nl, LAD mild diff dzs up to 25%, D1-6 nl, LCX min irregs, OM1 95 (3.5x28 Promus DES), RCA 50p/m, RPDA/PL nl.   GERD (gastroesophageal reflux disease)    Hyperlipidemia LDL goal <55    Hypertension    LVH (left ventricular hypertrophy)    a. 07/2015 Echo: EF 65-70%, mod LVH, nl RV fxn.   Neuropathy    Type 2 diabetes mellitus (HCC)    a. 04/2024 HbA1c 15.0.    Medications:  (Not in a hospital admission)   Assessment: Pharmacy consulted to dose heparin  in this 63 year old male admitted with ACS/NSTEMI.  No prior anticoag. CrCl = 101.8 ml/min  Goal of Therapy:  Heparin  level 0.3-0.7 units/ml Monitor platelets by anticoagulation protocol: Yes  9/14: HL @ 0557 = 0.47, therapeutic X 1 9/14: HL @ 1441 = 0.28,  SUBtherapeutic 9/14: HL @ 2256 = 0.51, therapeutic X 1  9/15: HL @ 0434 = 0.47, therapeutic X 2    Plan:  9/15:  HL @ 0434 = 0.47, therapeutic X 2  - will continue pt on current rate and recheck HL on 9/16 with AM labs  Continue to monitor CBC daily while on heparin  infusion.   Tyquez Hollibaugh D Clinical Pharmacist 07/22/2024 6:11 AM

## 2024-07-23 ENCOUNTER — Other Ambulatory Visit: Payer: Self-pay

## 2024-07-23 ENCOUNTER — Encounter: Payer: Self-pay | Admitting: Internal Medicine

## 2024-07-23 DIAGNOSIS — I214 Non-ST elevation (NSTEMI) myocardial infarction: Secondary | ICD-10-CM | POA: Diagnosis not present

## 2024-07-23 DIAGNOSIS — I10 Essential (primary) hypertension: Secondary | ICD-10-CM

## 2024-07-23 DIAGNOSIS — I5030 Unspecified diastolic (congestive) heart failure: Secondary | ICD-10-CM

## 2024-07-23 DIAGNOSIS — I2510 Atherosclerotic heart disease of native coronary artery without angina pectoris: Secondary | ICD-10-CM | POA: Diagnosis not present

## 2024-07-23 LAB — BASIC METABOLIC PANEL WITH GFR
Anion gap: 10 (ref 5–15)
BUN: 14 mg/dL (ref 8–23)
CO2: 27 mmol/L (ref 22–32)
Calcium: 9.3 mg/dL (ref 8.9–10.3)
Chloride: 100 mmol/L (ref 98–111)
Creatinine, Ser: 0.66 mg/dL (ref 0.61–1.24)
GFR, Estimated: >60 mL/min (ref >60)
Glucose, Bld: 168 mg/dL — ABNORMAL HIGH (ref 70–99)
Potassium: 4.2 mmol/L (ref 3.5–5.1)
Sodium: 137 mmol/L (ref 135–145)

## 2024-07-23 LAB — CBC
HCT: 52.7 % — ABNORMAL HIGH (ref 39.0–52.0)
Hemoglobin: 17.1 g/dL — ABNORMAL HIGH (ref 13.0–17.0)
MCH: 26.4 pg (ref 26.0–34.0)
MCHC: 32.4 g/dL (ref 30.0–36.0)
MCV: 81.5 fL (ref 80.0–100.0)
Platelets: 176 K/uL (ref 150–400)
RBC: 6.47 MIL/uL — ABNORMAL HIGH (ref 4.22–5.81)
RDW: 14 % (ref 11.5–15.5)
WBC: 10.4 K/uL (ref 4.0–10.5)
nRBC: 0 % (ref 0.0–0.2)

## 2024-07-23 LAB — GLUCOSE, CAPILLARY
Glucose-Capillary: 173 mg/dL — ABNORMAL HIGH (ref 70–99)
Glucose-Capillary: 184 mg/dL — ABNORMAL HIGH (ref 70–99)

## 2024-07-23 LAB — LIPOPROTEIN A (LPA): Lipoprotein (a): 69.2 nmol/L — ABNORMAL HIGH (ref <75.0)

## 2024-07-23 MED ORDER — INSULIN ASPART 100 UNIT/ML FLEXPEN
2.0000 [IU] | PEN_INJECTOR | Freq: Three times a day (TID) | SUBCUTANEOUS | 11 refills | Status: DC
  Filled 2024-07-23: qty 15, 30d supply, fill #0

## 2024-07-23 MED ORDER — SACUBITRIL-VALSARTAN 49-51 MG PO TABS
1.0000 | ORAL_TABLET | Freq: Two times a day (BID) | ORAL | Status: DC
  Administered 2024-07-23: 1 via ORAL
  Filled 2024-07-23: qty 1

## 2024-07-23 MED ORDER — INSULIN PEN NEEDLE 32G X 4 MM MISC
1.0000 | 0 refills | Status: AC
  Filled 2024-07-23: qty 100, 30d supply, fill #0

## 2024-07-23 MED ORDER — ATORVASTATIN CALCIUM 80 MG PO TABS
80.0000 mg | ORAL_TABLET | Freq: Every day | ORAL | 11 refills | Status: AC
  Filled 2024-07-23: qty 30, 30d supply, fill #0

## 2024-07-23 MED ORDER — PRASUGREL HCL 10 MG PO TABS
10.0000 mg | ORAL_TABLET | Freq: Every day | ORAL | 11 refills | Status: AC
  Filled 2024-07-23: qty 30, 30d supply, fill #0

## 2024-07-23 MED ORDER — BISACODYL 10 MG RE SUPP
10.0000 mg | Freq: Once | RECTAL | Status: AC
  Administered 2024-07-23: 10 mg via RECTAL
  Filled 2024-07-23: qty 1

## 2024-07-23 MED ORDER — SACUBITRIL-VALSARTAN 49-51 MG PO TABS
1.0000 | ORAL_TABLET | Freq: Two times a day (BID) | ORAL | 11 refills | Status: DC
  Filled 2024-07-23: qty 60, 30d supply, fill #0

## 2024-07-23 MED ORDER — CARVEDILOL 3.125 MG PO TABS
3.1250 mg | ORAL_TABLET | Freq: Two times a day (BID) | ORAL | 11 refills | Status: DC
  Filled 2024-07-23: qty 60, 30d supply, fill #0

## 2024-07-23 MED ORDER — LIVING WELL WITH DIABETES BOOK
Freq: Once | Status: DC
  Filled 2024-07-23: qty 1

## 2024-07-23 NOTE — Discharge Summary (Signed)
 [facility] Discharge Summary   Patient: Frank Miles FMW:[id number]  PCP: Claudene Tanda POUR, PA-C  Date of admission: 07/20/2024   Date of discharge:  07/23/2024     Discharge Diagnoses:  Principal Problem:   NSTEMI (non-ST elevated myocardial infarction) [facility]) Active Problems:   CAD S/P LCx stent 2016   Hypertensive urgency   Uncontrolled type 2 diabetes mellitus with hyperglycemia, with long-term current use of insulin  (HCC)   Admitted From: Home Disposition:  Home   Recommendations for Outpatient Follow-up:  Follow-up with PCP in 1 week Follow-up with cardiology in 1 week. Follow up LABS/TEST:     Follow-up Information     Claudene Tanda POUR, PA-C Follow up in 1 week(s).   Specialty: Physician Assistant Contact information: [address] [place] [address] [phone]         End, Lonni, MD Follow up in 1 week(s).   Specialty: Cardiology Contact information: [address] [place] [address] [phone]                Diet recommendation: Cardiac and Carb modified diet  Activity: The patient is advised to gradually reintroduce usual activities, as tolerated  Discharge Condition: stable  Code Status: Full code   History of present illness: As per the H and P dictated on admission.  Hospital Course:    Frank Miles is a 63 y.o. male with medical history significant for Insulin -dependent type 2 diabetes, HTN, history of NSTEMI 2016 s/p LCx stent, being admitted with NSTEMI.  He presented with sudden onset left-sided chest pain starting an hour prior to arrival.  He states the pain felt like something was stuck in the middle of his chest when he took an antacid.  It was nonradiating.  He had associated diaphoresis without nausea or vomiting or shortness of breath and no lightheadedness or palpitations.  He was previously in his usual state of health. On arrival BP 237/102 with otherwise normal  vitals. Labs notable for Troponin 19-->278. Glucose 299 EKG showed NSR at 68 with no ischemic ST-T wave changes Chest x-ray nonacute Patient given chewable aspirin  324 mg, started on a heparin  drip. Admission requested.      Assessment and Plan:   #NSTEMI (non-ST elevated myocardial infarction) (HCC) History of NSTEMI 2016 s/p LCx stent Continue aspirin  and atorvastatin  S/p heparin  infusion and NTG sublingual prn  Metoprolol  for BP control Cardiology consulted, s/p Cardiac Cath PCI with DES x 3 to heavily diseased RCA.  He Patient was discharged on aspirin  81 mg p.o. daily, Effient  10 mg p.o. daily, Coreg  3.125 mg p.o. daily.  Lipitor  80 mg p.o. daily    # Hypertensive urgency IV metoprolol  given to control blood pressure.  Patient was on lisinopril  at home which was discontinued and started on Entresto .  Patient was also started on Coreg .  Patient was cleared by cardiology to discharge and follow-up as an outpatient.  Patient was advised to monitor BP at home.     # Uncontrolled type 2 diabetes mellitus with hyperglycemia, with long-term current use of insulin  (HCC) Continue Lantus . S/p Sliding scale insulin  coverage.  HbA1c 10.1, uncontrolled diabetes. Patient was discharged on Lantus  same dose and NovoLog  sliding scale.  Patient was advised to monitor CBG at home and continue diabetic diet.  Follow with PCP.    Body mass index is 27.83 kg/m.  Nutrition Interventions:  Patient was ambulatory without any assistance.  On the day of the discharge the patient's vitals  were stable, and no other acute medical condition were reported by patient. the patient was felt safe to be discharge at Home.  Consultants: Cardiology Procedures: Cardiac cath s/p PCI RCA DES x 3   Discharge Exam: General: Appear in no distress, Oral Mucosa Clear, moist. Cardiovascular: S1 and S2 Present, no Murmur, Respiratory: normal respiratory effort, Bilateral Air entry present and no Crackles, no  wheezes Abdomen: Bowel Sound present, Soft and no tenderness.  Extremities: no Pedal edema, no calf tenderness Neurology: alert and oriented to time, place, and person affect appropriate.  Filed Weights   07/20/24 2139 07/21/24 0056  Weight: 84.4 kg 83 kg   Vitals:   07/23/24 0841 07/23/24 1230  BP: (!) 171/85 (!) 170/85  Pulse: 62 67  Resp: 18 18  Temp: 98.3 F (36.8 C) 97.8 F (36.6 C)  SpO2: 98% 100%    DISCHARGE MEDICATION: Allergies as of 07/23/2024   No Known Allergies      Medication List     STOP taking these medications    glipiZIDE  10 MG 24 hr tablet Commonly known as: GLUCOTROL  XL   lisinopril  20 MG tablet Commonly known as: ZESTRIL    lisinopril  40 MG tablet Commonly known as: ZESTRIL    metFORMIN  500 MG tablet Commonly known as: GLUCOPHAGE        TAKE these medications    aspirin  81 MG tablet Take 81 mg by mouth daily.   atorvastatin  80 MG tablet Commonly known as: LIPITOR  Take 1 tablet (80 mg total) by mouth daily.   carvedilol  3.125 MG tablet Commonly known as: COREG  Take 1 tablet (3.125 mg total) by mouth 2 (two) times daily with a meal.   glucose blood test strip Use as instructed   insulin  aspart 100 UNIT/ML FlexPen Commonly known as: NOVOLOG  Inject 2-15 Units into the skin 3 (three) times daily before meals. CBG 121 - 150: 2 units CBG 151 - 200: 3 units CBG 201 - 250: 5 units CBG 251 - 300: 8 units CBG 301 - 350: 11 units CBG 351 - 400: 15 units   Insulin  Pen Needle 32G X 4 MM Misc Use 1 each as directed.   Lantus  100 UNIT/ML injection Generic drug: insulin  glargine Inject 12-16 Units into the skin at bedtime. Per BS reading   onetouch ultrasoft lancets Use as instructed   OneTouch Verio w/Device Kit 1 Device by Does not apply route every morning.   Blood Glucose Monitoring Suppl Devi 1 each by Does not apply route in the morning, at noon, and at bedtime. May substitute to any manufacturer covered by patient's  insurance.   prasugrel  10 MG Tabs tablet Commonly known as: EFFIENT  Take 1 tablet (10 mg total) by mouth daily. Start taking on: July 24, 2024   sacubitril -valsartan  49-51 MG Commonly known as: ENTRESTO  Take 1 tablet by mouth 2 (two) times daily.       No Known Allergies Discharge Instructions     AMB Referral to Cardiac Rehabilitation - Phase II   Complete by: As directed    Diagnosis:  NSTEMI Coronary Stents     After initial evaluation and assessments completed: Virtual Based Care may be provided alone or in conjunction with Phase 2 Cardiac Rehab based on patient barriers.: Yes   Intensive Cardiac Rehabilitation (ICR) [facility] location only OR Traditional Cardiac Rehabilitation (TCR) *If criteria for ICR are not met will enroll in TCR [facility] only): Yes   Call MD for:   Complete by: As directed    Chest pain  or palpitations   Call MD for:  difficulty breathing, headache or visual disturbances   Complete by: As directed    Call MD for:  extreme fatigue   Complete by: As directed    Call MD for:  persistant dizziness or light-headedness   Complete by: As directed    Call MD for:  persistant nausea and vomiting   Complete by: As directed    Call MD for:  severe uncontrolled pain   Complete by: As directed    Call MD for:  temperature >100.4   Complete by: As directed    Diet - low sodium heart healthy   Complete by: As directed    Diet Carb Modified   Complete by: As directed    Discharge instructions   Complete by: As directed    Follow-up with PCP in 1 week Follow-up with cardiology in 1 week.   Increase activity slowly   Complete by: As directed        The results of significant diagnostics from this hospitalization (including imaging, microbiology, ancillary and laboratory) are listed below for reference.    Significant Diagnostic Studies: CARDIAC CATHETERIZATION Result Date: 07/22/2024   Dist LAD lesion is 100% stenosed.   1st Diag lesion is 95% stenosed.    Prox Cx to Mid Cx lesion is 10% stenosed.   Ost Cx to Prox Cx lesion is 20% stenosed.   Mid RCA to Dist RCA lesion is 70% stenosed.   Prox RCA to Mid RCA lesion is 99% stenosed.   Prox RCA lesion is 70% stenosed.   A drug-eluting stent was successfully placed using a STENT ONYX FRONTIER [id number].   A drug-eluting stent was successfully placed using a STENT ONYX FRONTIER 3.0X38.   A drug-eluting stent was successfully placed using a STENT ONYX FRONTIER 3.5X12.   Post intervention, there is a 0% residual stenosis.   Post intervention, there is a 0% residual stenosis.   Post intervention, there is a 0% residual stenosis.   In the absence of any other complications or medical issues, we expect the patient to be ready for discharge from an interventional cardiology perspective on 07/23/2024.   Recommend dual antiplatelet therapy with Aspirin  81mg  daily and Prasugrel  10mg  daily. 1.  Patent left circumflex stent with mild in-stent restenosis.  Occluded apical LAD with collaterals and significant disease in distal diagonal branch with trace to small to stent.  Severe stenosis in the mid right coronary artery is the likely culprit and the whole vessel is diffusely diseased from the proximal to the distal segment. 2.  Left ventricular angiography was not performed.  EF was low normal by echo.  Mildly elevated left ventricular end-diastolic pressure. 3.  Successful OCT guided angioplasty and 3 overlapped drug-eluting stents to the right coronary artery.  Difficult procedure due to diffuse disease. Recommendations: Will keep another night for blood pressure control.  I added small dose Coreg  and increased irbesartan .  Recommend switching irbesartan  to Entresto  tomorrow if blood pressure remains elevated.  The patient was on lisinopril  which was discontinued on Saturday. Recommend long-term dual antiplatelet therapy given multiple overlapping stents.   ECHOCARDIOGRAM COMPLETE Result Date: 07/22/2024    ECHOCARDIOGRAM REPORT    Patient Name:   DYLLIN GULLEY Date of Exam: 07/22/2024 Medical Rec #:  [id number]             Height:       68.0 in Accession #:    [id number]  Weight:       183.0 lb Date of Birth:  [birth year]             BSA:          1.968 m Patient Age:    62 years              BP:           131/77 mmHg Patient Gender: M                     HR:           58 bpm. Exam Location:  [facility] Procedure: 2D Echo, Cardiac Doppler, Color Doppler, Strain Analysis and            Intracardiac Opacification Agent (Both Spectral and Color Flow            Doppler were utilized during procedure). Indications:     NSTEMI I21.4  History:         Patient has no prior history of Echocardiogram examinations.  Sonographer:     Rosina Dunk Referring Phys:  [id number] BRIDGETTE CHRISTOPHER Diagnosing Phys: Deatrice Cage MD  Sonographer Comments: Global longitudinal strain was attempted. IMPRESSIONS  1. Left ventricular ejection fraction, by estimation, is 50 to 55%. The left ventricle has low normal function. The left ventricle has no regional wall motion abnormalities. There is moderate left ventricular hypertrophy. Left ventricular diastolic parameters are consistent with Grade I diastolic dysfunction (impaired relaxation).  2. Right ventricular systolic function is normal. The right ventricular size is normal.  3. The mitral valve is normal in structure. Trivial mitral valve regurgitation. No evidence of mitral stenosis.  4. The aortic valve is normal in structure. Aortic valve regurgitation is not visualized. No aortic stenosis is present.  5. The inferior vena cava is normal in size with greater than 50% respiratory variability, suggesting right atrial pressure of 3 mmHg. FINDINGS  Left Ventricle: Left ventricular ejection fraction, by estimation, is 50 to 55%. The left ventricle has low normal function. The left ventricle has no regional wall motion abnormalities. Definity  contrast agent was given IV to delineate the left  ventricular endocardial borders. Global longitudinal strain performed but not reported based on interpreter judgement due to suboptimal tracking. The left ventricular internal cavity size was normal in size. There is moderate left ventricular hypertrophy. Left ventricular diastolic parameters are consistent with Grade I diastolic dysfunction (impaired relaxation). Right Ventricle: The right ventricular size is normal. No increase in right ventricular wall thickness. Right ventricular systolic function is normal. Left Atrium: Left atrial size was normal in size. Right Atrium: Right atrial size was normal in size. Pericardium: There is no evidence of pericardial effusion. Mitral Valve: The mitral valve is normal in structure. There is mild thickening of the mitral valve leaflet(s). Trivial mitral valve regurgitation. No evidence of mitral valve stenosis. MV peak gradient, 3.4 mmHg. The mean mitral valve gradient is 1.0 mmHg. Tricuspid Valve: The tricuspid valve is normal in structure. Tricuspid valve regurgitation is not demonstrated. No evidence of tricuspid stenosis. Aortic Valve: The aortic valve is normal in structure. Aortic valve regurgitation is not visualized. No aortic stenosis is present. Aortic valve mean gradient measures 4.0 mmHg. Aortic valve peak gradient measures 6.8 mmHg. Aortic valve area, by VTI measures 2.28 cm. Pulmonic Valve: The pulmonic valve was normal in structure. Pulmonic valve regurgitation is trivial. No evidence of pulmonic stenosis. Aorta: The aortic root is normal in size and structure. Venous: The  inferior vena cava is normal in size with greater than 50% respiratory variability, suggesting right atrial pressure of 3 mmHg. IAS/Shunts: No atrial level shunt detected by color flow Doppler.  LEFT VENTRICLE PLAX 2D LVIDd:         4.70 cm      Diastology LVIDs:         3.20 cm      LV e' medial:    4.13 cm/s LV PW:         1.30 cm      LV E/e' medial:  12.8 LV IVS:        1.30 cm      LV  e' lateral:   5.78 cm/s LVOT diam:     2.10 cm      LV E/e' lateral: 9.2 LV SV:         58 LV SV Index:   29 LVOT Area:     3.46 cm  LV Volumes (MOD) LV vol d, MOD A2C: 43.0 ml LV vol d, MOD A4C: 121.0 ml LV vol s, MOD A2C: 23.2 ml LV vol s, MOD A4C: 46.3 ml LV SV MOD A2C:     19.8 ml LV SV MOD A4C:     121.0 ml LV SV MOD BP:      40.9 ml RIGHT VENTRICLE RV Basal diam:  3.50 cm RV Mid diam:    2.30 cm RV S prime:     12.60 cm/s TAPSE (M-mode): 3.0 cm LEFT ATRIUM             Index        RIGHT ATRIUM           Index LA diam:        3.70 cm 1.88 cm/m   RA Area:     15.40 cm LA Vol (A2C):   69.9 ml 35.52 ml/m  RA Volume:   39.10 ml  19.87 ml/m LA Vol (A4C):   50.0 ml 25.41 ml/m LA Biplane Vol: 59.9 ml 30.44 ml/m  AORTIC VALVE                    PULMONIC VALVE AV Area (Vmax):    2.13 cm     PV Vmax:        0.86 m/s AV Area (Vmean):   2.03 cm     PV Vmean:       60.600 cm/s AV Area (VTI):     2.28 cm     PV VTI:         0.193 m AV Vmax:           130.00 cm/s  PV Peak grad:   3.0 mmHg AV Vmean:          87.300 cm/s  PV Mean grad:   2.0 mmHg AV VTI:            0.254 m      RVOT Peak grad: 2 mmHg AV Peak Grad:      6.8 mmHg AV Mean Grad:      4.0 mmHg LVOT Vmax:         79.80 cm/s LVOT Vmean:        51.100 cm/s LVOT VTI:          0.167 m LVOT/AV VTI ratio: 0.66  AORTA Ao Root diam: 3.00 cm Ao Asc diam:  2.70 cm MITRAL VALVE MV Area (PHT): 2.50 cm    SHUNTS MV Area VTI:   1.95  cm    Systemic VTI:  0.17 m MV Peak grad:  3.4 mmHg    Systemic Diam: 2.10 cm MV Mean grad:  1.0 mmHg    Pulmonic VTI:  0.177 m MV Vmax:       0.92 m/s MV Vmean:      49.7 cm/s MV Decel Time: 303 msec MV E velocity: 52.90 cm/s MV A velocity: 86.50 cm/s MV E/A ratio:  0.61 Deatrice Cage MD Electronically signed by Deatrice Cage MD Signature Date/Time: 07/22/2024/10:55:37 AM    Final    DG Chest 2 View Result Date: 07/20/2024 CLINICAL DATA:  Left-sided chest pain for 1 hour, initial encounter EXAM: CHEST - 2 VIEW COMPARISON:  None  Available. FINDINGS: The heart size and mediastinal contours are within normal limits. Both lungs are clear. The visualized skeletal structures are unremarkable. IMPRESSION: No active cardiopulmonary disease. Electronically Signed   By: Oneil Devonshire M.D.   On: 07/20/2024 22:06    Microbiology: No results found for this or any previous visit (from the past 240 hours).   Labs: CBC: Recent Labs  Lab 07/20/24 2148 07/22/24 0434 07/23/24 0353  WBC 9.1 7.5 10.4  HGB 16.3 15.5 17.1*  HCT 48.7 47.3 52.7*  MCV 80.1 80.9 81.5  PLT 159 152 176   Basic Metabolic Panel: Recent Labs  Lab 07/20/24 2148 07/21/24 1000 07/22/24 0434 07/23/24 0353  NA 136  --  136 137  K 3.7  --  3.5 4.2  CL 96*  --  97* 100  CO2 27  --  30 27  GLUCOSE 299*  --  174* 168*  BUN 22  --  16 14  CREATININE 0.77  --  0.75 0.66  CALCIUM  9.6  --  8.9 9.3  MG  --  2.0 2.0  --   PHOS  --   --  3.6  --    Liver Function Tests: No results for input(s): AST, ALT, ALKPHOS, BILITOT, PROT, ALBUMIN in the last 168 hours. No results for input(s): LIPASE, AMYLASE in the last 168 hours. No results for input(s): AMMONIA in the last 168 hours. Cardiac Enzymes: No results for input(s): CKTOTAL, CKMB, CKMBINDEX, TROPONINI in the last 168 hours. BNP (last 3 results) No results for input(s): BNP in the last 8760 hours. CBG: Recent Labs  Lab 07/22/24 1327 07/22/24 1630 07/22/24 2136 07/23/24 0840 07/23/24 1229  GLUCAP 147* 274* 189* 173* 184*    Time spent: 35 minutes  Signed:  Elvan Sor  [facility] 07/23/2024 1:21 PM

## 2024-07-23 NOTE — Plan of Care (Signed)
  Problem: Education: Goal: Ability to describe self-care measures that may prevent or decrease complications (Diabetes Survival Skills Education) will improve Outcome: Progressing Goal: Individualized Educational Video(s) Outcome: Progressing   Problem: Coping: Goal: Ability to adjust to condition or change in health will improve Outcome: Progressing   Problem: Fluid Volume: Goal: Ability to maintain a balanced intake and output will improve Outcome: Progressing   Problem: Health Behavior/Discharge Planning: Goal: Ability to identify and utilize available resources and services will improve Outcome: Progressing Goal: Ability to manage health-related needs will improve Outcome: Progressing   Problem: Metabolic: Goal: Ability to maintain appropriate glucose levels will improve Outcome: Progressing   Problem: Nutritional: Goal: Maintenance of adequate nutrition will improve Outcome: Progressing Goal: Progress toward achieving an optimal weight will improve Outcome: Progressing   Problem: Skin Integrity: Goal: Risk for impaired skin integrity will decrease Outcome: Progressing   Problem: Tissue Perfusion: Goal: Adequacy of tissue perfusion will improve Outcome: Progressing   Problem: Education: Goal: Understanding of cardiac disease, CV risk reduction, and recovery process will improve Outcome: Progressing Goal: Individualized Educational Video(s) Outcome: Progressing   Problem: Activity: Goal: Ability to tolerate increased activity will improve Outcome: Progressing   Problem: Cardiac: Goal: Ability to achieve and maintain adequate cardiovascular perfusion will improve Outcome: Progressing   Problem: Health Behavior/Discharge Planning: Goal: Ability to safely manage health-related needs after discharge will improve Outcome: Progressing   Problem: Education: Goal: Understanding of CV disease, CV risk reduction, and recovery process will improve Outcome:  Progressing Goal: Individualized Educational Video(s) Outcome: Progressing   Problem: Activity: Goal: Ability to return to baseline activity level will improve Outcome: Progressing   Problem: Cardiovascular: Goal: Ability to achieve and maintain adequate cardiovascular perfusion will improve Outcome: Progressing Goal: Vascular access site(s) Level 0-1 will be maintained Outcome: Progressing   Problem: Health Behavior/Discharge Planning: Goal: Ability to safely manage health-related needs after discharge will improve Outcome: Progressing   Problem: Education: Goal: Knowledge of General Education information will improve Description: Including pain rating scale, medication(s)/side effects and non-pharmacologic comfort measures Outcome: Progressing   Problem: Health Behavior/Discharge Planning: Goal: Ability to manage health-related needs will improve Outcome: Progressing   Problem: Clinical Measurements: Goal: Ability to maintain clinical measurements within normal limits will improve Outcome: Progressing Goal: Will remain free from infection Outcome: Progressing Goal: Diagnostic test results will improve Outcome: Progressing Goal: Respiratory complications will improve Outcome: Progressing Goal: Cardiovascular complication will be avoided Outcome: Progressing   Problem: Activity: Goal: Risk for activity intolerance will decrease Outcome: Progressing   Problem: Nutrition: Goal: Adequate nutrition will be maintained Outcome: Progressing   Problem: Coping: Goal: Level of anxiety will decrease Outcome: Progressing   Problem: Elimination: Goal: Will not experience complications related to bowel motility Outcome: Progressing Goal: Will not experience complications related to urinary retention Outcome: Progressing   Problem: Pain Managment: Goal: General experience of comfort will improve and/or be controlled Outcome: Progressing   Problem: Safety: Goal: Ability  to remain free from injury will improve Outcome: Progressing   Problem: Skin Integrity: Goal: Risk for impaired skin integrity will decrease Outcome: Progressing

## 2024-07-23 NOTE — Progress Notes (Signed)
 Rounding Note   Patient Name: Frank Miles Date of Encounter: 07/23/2024  [facility] Cardiologist: Deatrice Cage, MD  Subjective Patient has been able to walk the hallways without issue. He denies chest pain and shortness of breath. BP remains elevated. Right radial cath site is stable.   Scheduled Meds:  aspirin  EC  81 mg Oral Daily   atorvastatin   80 mg Oral Daily   carvedilol   3.125 mg Oral BID WC   insulin  aspart  0-15 Units Subcutaneous TID WC   insulin  aspart  0-5 Units Subcutaneous QHS   insulin  glargine  15 Units Subcutaneous QHS   prasugrel   10 mg Oral Daily   sacubitril -valsartan   1 tablet Oral BID   sodium chloride  flush  3 mL Intravenous Q12H   Continuous Infusions:  sodium chloride      PRN Meds: sodium chloride , acetaminophen , hydrALAZINE , nitroGLYCERIN , ondansetron  (ZOFRAN ) IV, sodium chloride  flush   Vital Signs  Vitals:   07/22/24 1614 07/22/24 2347 07/23/24 0323 07/23/24 0841  BP: (!) 169/70 (!) 154/76 (!) 160/82 (!) 171/85  Pulse: 87 63 63 62  Resp: 17 18 18 18   Temp: 97.9 F (36.6 C) 97.7 F (36.5 C) 97.7 F (36.5 C) 98.3 F (36.8 C)  TempSrc: Oral   Oral  SpO2: 100% 99% 100% 98%  Weight:      Height:        Intake/Output Summary (Last 24 hours) at 07/23/2024 0915 Last data filed at 07/22/2024 1921 Gross per 24 hour  Intake 620 ml  Output 1300 ml  Net -680 ml      07/21/2024   12:56 AM 07/20/2024    9:39 PM 04/17/2024    9:13 AM  Last 3 Weights  Weight (lbs) 183 lb 186 lb 1.1 oz 186 lb  Weight (kg) 83.008 kg 84.4 kg 84.369 kg      Telemetry Sinus rhythm - Personally Reviewed  Physical Exam  GEN: No acute distress.   Neck: No JVD Cardiac: RRR, no murmurs, rubs, or gallops.  Respiratory: Clear to auscultation bilaterally. GI: Soft, nontender, non-distended  MS: No edema; No deformity. Neuro:  Nonfocal  Psych: Normal affect   Labs High Sensitivity Troponin:   Recent Labs  Lab 07/20/24 2148 07/20/24 2336  07/21/24 1000  TROPONINIHS 19* 278* 9,333*     Chemistry Recent Labs  Lab 07/20/24 2148 07/21/24 1000 07/22/24 0434 07/23/24 0353  NA 136  --  136 137  K 3.7  --  3.5 4.2  CL 96*  --  97* 100  CO2 27  --  30 27  GLUCOSE 299*  --  174* 168*  BUN 22  --  16 14  CREATININE 0.77  --  0.75 0.66  CALCIUM  9.6  --  8.9 9.3  MG  --  2.0 2.0  --   GFRNONAA >60  --  >60 >60  ANIONGAP 13  --  9 10    Lipids  Recent Labs  Lab 07/22/24 0434  CHOL 179  TRIG 85  HDL 48  LDLCALC 114*  CHOLHDL 3.7    Hematology Recent Labs  Lab 07/20/24 2148 07/22/24 0434 07/23/24 0353  WBC 9.1 7.5 10.4  RBC 6.08* 5.85* 6.47*  HGB 16.3 15.5 17.1*  HCT 48.7 47.3 52.7*  MCV 80.1 80.9 81.5  MCH 26.8 26.5 26.4  MCHC 33.5 32.8 32.4  RDW 13.9 13.9 14.0  PLT 159 152 176   Thyroid No results for input(s): TSH, FREET4 in the last 168 hours.  BNPNo results for input(s): BNP, PROBNP in the last 168 hours.  DDimer No results for input(s): DDIMER in the last 168 hours.   Cardiac Studies  07/22/2024 LHC 1.  Patent left circumflex stent with mild in-stent restenosis.  Occluded apical LAD with collaterals and significant disease in distal diagonal branch with trace to small to stent.  Severe stenosis in the mid right coronary artery is the likely culprit and the whole vessel is diffusely diseased from the proximal to the distal segment. 2.  Left ventricular angiography was not performed.  EF was low normal by echo.  Mildly elevated left ventricular end-diastolic pressure. 3.  Successful OCT guided angioplasty and 3 overlapped drug-eluting stents to the right coronary artery.  Difficult procedure due to diffuse disease.   07/22/2024 Echo complete 1. Left ventricular ejection fraction, by estimation, is 50 to 55%. The left ventricle has low normal function. The left ventricle has no regional  wall motion abnormalities. There is moderate left ventricular hypertrophy. Left ventricular diastolic   parameters are consistent with Grade I diastolic dysfunction (impaired relaxation).   2. Right ventricular systolic function is normal. The right ventricular size is normal.   3. The mitral valve is normal in structure. Trivial mitral valve  regurgitation. No evidence of mitral stenosis.   4. The aortic valve is normal in structure. Aortic valve regurgitation is not visualized. No aortic stenosis is present.   5. The inferior vena cava is normal in size with greater than 50% respiratory variability, suggesting right atrial pressure of 3 mmHg.   Patient Profile   63 y.o. male  with a history of CAD status post non-STEMI and prior OM1 stenting in September 2021, LVH, hypertension, hyperlipidemia, poorly controlled diabetes (A1c is 15.0 in June 2025), GERD, and neuropathy, who is being seen for the ongoing evaluation and management of NSTEMI.   Assessment & Plan   Coronary artery disease NSTEMI - Patient presented 9/13 with chest pressure and burning with diaphoresis.  Markedly hypertensive on admission - EKG without acute ischemic changes - Troponin up to 9333 - Echo showed EF 50 to 55% with G1 DD - Cardiac catheterization revealed patent left circumflex stent with minimal restenosis and severe subocclusive disease involving the RCA which was treated with PCI and 3 overlapping DES - Right radial cath site is stable without signs of bleeding or hematochezia - Continue aspirin  indefinitely and prasugrel  for minimum of 12 months but preferably long-term if tolerated given multiple overlapping stents - Continue carvedilol  and atorvastatin   Diastolic heart failure Hypertension - Echo with EF 50 to 55% with G1 1 DD - Appears euvolemic on exam - BP remains above goal - He is continued on carvedilol  3.125 mg twice daily - Will transition from irbesartan  to Entresto  49-51 mg twice daily for further blood pressure control  Hyperlipidemia - LDL 114, not on statin therapy PTA - Continue  atorvastatin  80 mg and plan for recheck lipid panel as outpatient  T2DM - A1c 15.0 on admission, recently established with endocrinology as an outpatient - Ongoing management per IM  ?Sleep apnea - Apneic episodes noted during admission - Recommend outpatient sleep study   For questions or updates, please contact  [facility] Please consult [email or web address] for contact info under       Signed, Lesley LITTIE Maffucci, PA-C  07/23/2024, 9:15 AM

## 2024-07-23 NOTE — Inpatient Diabetes Management (Signed)
 Inpatient Diabetes Program Recommendations  AACE/ADA: New Consensus Statement on Inpatient Glycemic Control (2015)  Target Ranges:  Prepandial:   less than 140 mg/dL      Peak postprandial:   less than 180 mg/dL (1-2 hours)      Critically ill patients:  140 - 180 mg/dL   Lab Results  Component Value Date   GLUCAP 189 (H) 07/22/2024   HGBA1C 10.1 (H) 07/22/2024    Review of Glycemic Control  Diabetes history: DM2 Outpatient Diabetes medications:   Lantus  12-16 units daily Current orders for Inpatient glycemic control:  Lantus  15 units daily Novolog  0-15 units tid, 0-5 units hs  Inpatient Diabetes Program Recommendations:   Patient was seen by [facility] @ [facility] on 05/22/24. Patient has recently restarted his insulin  Lantus . Patient states he stopped Metformin  due to fears that he has heard about side effects. States willingness to add back his Glucotrol . Reviewed with patient lantus  covers CBGs throughout the day but does not cover his CBGs after meals.  Spoke with pt about A1C results 10.1 (average blood glucose 243 over the past 2-3 months) and explained what an A1C is, basic pathophysiology of DM Type 2, basic home care, basic diabetes diet nutrition principles, importance of checking CBGs and maintaining good CBG control to prevent long-term and short-term complications. Reviewed signs and symptoms of hyperglycemia and hypoglycemia and how to treat hypoglycemia at home. Also reviewed blood sugar goals at home.  RNs to provide ongoing basic DM education at bedside with this patient.   Thank you, Aina Rossbach E. Yvett Rossel, RN, MSN, CNS, CDCES  Diabetes Coordinator Inpatient Glycemic Control Team Team Pager [phone] (8am-5pm) 07/23/2024 10:08 AM

## 2024-07-24 ENCOUNTER — Ambulatory Visit: Admitting: Physician Assistant

## 2024-07-31 NOTE — Progress Notes (Unsigned)
 Cardiology Office Note    Date:  08/01/2024   ID:  Frank Miles, DOB 1961-04-26, MRN [id number]  PCP:  Claudene Tanda POUR, PA-C  Cardiologist:  Deatrice Cage, MD  Electrophysiologist:  None   Chief Complaint: Hospital follow up  History of Present Illness:   Frank Miles is a 63 y.o. male with history of CAD, hypertension, hyperlipidemia, LVH, poorly controlled diabetes, GERD, and neuropathy who presents for hospital follow up.     Patient was previously admitted to [facility] in 07/2015 in the setting of NSTEMI. Echo showed EF 65-70% with moderate LVH and normal RV function. Diagnostic catheterization revealed mild diffuse LAD disease, moderate RCA disease, and a 95% stenosis in the first obtuse marginal, which was felt to be the culprit vessel and successfully stented with 3.5 x 28 mm Promus DES. He had not been followed by cardiology since that hospitalization.    Patient presented to [facility] ED on 07/19/2024 with complaints of substernal chest heaviness, pressure, and burning. He originally thought a piece of food had stuck in his throat. However, he became diaphoretic and did not have relief with TUMs which prompted him to report to the ED. Initial BP was markedly elevated. EKG was without acute ischemic changes. Troponin peaked at 9333. Echo showed EF 50-55% with G1DD. Cardiac catheterization revealed patent LCx stent with minimal restenosis and severe subocclusive disease involving the RCA which was treated with PCI and 3 overlapping drug eluding stents. It was recommended that patient continue aspirin  and prasugrel  for a minimum of 12 months but preferably long term if tolerated given multiple overlapping stents. He was started on Entresto  for blood pressure control in addition to carvedilol . Patient was noted to have apneic episodes in his sleep during admission. He was discharged home in stable condition on 9/16.   Patient presents to clinic today overall doing well from a cardiac  perspective.  He reports that he is slowly easing back into daily activity.  He denies any further episodes of chest pain since hospital discharge.  He has been working on eating healthier, focusing on increasing his intake of lean meats and vegetables and reducing intake of breads and potatoes.  He is without symptoms of angina or cardiac decompensation.  He denies chest pain, shortness of breath, lightheadedness, dizziness, and lower extremity swelling.  He denies bleeding and hematochezia.  He has been compliant with his medications without adverse effect.  Patient works for the city and drives a truck.  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  Labs independently reviewed: 07/22/2024- A1C 10.1, TC 179, TG 85, HDL 48, LDL 114, Na 136, K 3.5, BUN 16, Cr 0.75, Hgb 15.5, HCT 47.3, platelets 152  Objective   Past Medical History:  Diagnosis Date   Anemia    CAD (coronary artery disease)    a. 07/2015 NSTEMI/PCI [facility]): LM nl, LAD mild diff dzs up to 25%, D1-6 nl, LCX min irregs, OM1 95 (3.5x28 Promus DES), RCA 50p/m, RPDA/PL nl.   GERD (gastroesophageal reflux disease)    Hyperlipidemia LDL goal <55    Hypertension    LVH (left ventricular hypertrophy)    a. 07/2015 Echo: EF 65-70%, mod LVH, nl RV fxn.   Neuropathy    Type 2 diabetes mellitus (HCC)    a. 04/2024 HbA1c 15.0.    Current Medications: Current Meds  Medication Sig   aspirin  81 MG tablet Take 81 mg by mouth daily.   atorvastatin  (LIPITOR ) 80  MG tablet Take 1 tablet (80 mg total) by mouth daily.   carvedilol  (COREG ) 3.125 MG tablet Take 1 tablet (3.125 mg total) by mouth 2 (two) times daily with a meal.   empagliflozin  (JARDIANCE ) 10 MG TABS tablet Take 1 tablet (10 mg total) by mouth daily before breakfast.   insulin  aspart (NOVOLOG ) 100 UNIT/ML FlexPen Inject 2-15 Units into the skin 3 (three) times daily before meals. CBG 121 - 150: 2 units CBG 151 - 200: 3 units CBG 201 - 250: 5 units CBG 251 - 300: 8  units CBG 301 - 350: 11 units CBG 351 - 400: 15 units   insulin  glargine (LANTUS ) 100 UNIT/ML injection Inject 12-16 Units into the skin at bedtime. Per BS reading   Insulin  Pen Needle 32G X 4 MM MISC Use 1 each as directed.   prasugrel  (EFFIENT ) 10 MG TABS tablet Take 1 tablet (10 mg total) by mouth daily.   sacubitril -valsartan  (ENTRESTO ) 97-103 MG Take 1 tablet by mouth 2 (two) times daily.   [DISCONTINUED] sacubitril -valsartan  (ENTRESTO ) 49-51 MG Take 1 tablet by mouth 2 (two) times daily.    Allergies:   Patient has no known allergies.   Social History   Socioeconomic History   Marital status: Divorced    Spouse name: Not on file   Number of children: Not on file   Years of education: Not on file   Highest education level: Not on file  Occupational History   Not on file  Tobacco Use   Smoking status: Former    Current packs/day: 0.00    Types: Cigarettes    Quit date: 10/07/2016    Years since quitting: 7.8   Smokeless tobacco: Never  Vaping Use   Vaping status: Never Used  Substance and Sexual Activity   Alcohol use: No   Drug use: Not Currently   Sexual activity: Yes  Other Topics Concern   Not on file  Social History Narrative   Lives alone (divorced 2013). 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   Social Drivers of Corporate investment banker Strain: Low Risk  (05/08/2024)   Received from [facility]   Overall Financial Resource Strain (CARDIA)    Difficulty of Paying Living Expenses: Not hard at all  Food Insecurity: No Food Insecurity (07/22/2024)   Hunger Vital Sign    Worried About Running Out of Food in the Last Year: Never true    Ran Out of Food in the Last Year: Never true  Transportation Needs: No Transportation Needs (07/22/2024)   PRAPARE - Administrator, Civil Service (Medical): No    Lack of Transportation (Non-Medical): No  Physical Activity: Not  on file  Stress: Not on file  Social Connections: Not on file     Family History:  The patient's family history includes Alcohol abuse in his brother, brother, and father; Arthritis in his mother; Depression in his brother and sister; Diabetes in his mother; Heart attack in his brother; Heart disease in his mother; Lung cancer in his brother; Mental illness in his brother; Stomach cancer in his brother.  ROS:   12-point review of systems is negative unless otherwise noted in the HPI.  EKGs/Other Studies Reviewed:    Studies reviewed were summarized above. The additional studies were reviewed today:  07/22/2024 LHC 1.  Patent left circumflex stent with mild in-stent restenosis.  Occluded apical LAD with collaterals  and significant disease in distal diagonal branch with trace to small to stent.  Severe stenosis in the mid right coronary artery is the likely culprit and the whole vessel is diffusely diseased from the proximal to the distal segment. 2.  Left ventricular angiography was not performed.  EF was low normal by echo.  Mildly elevated left ventricular end-diastolic pressure. 3.  Successful OCT guided angioplasty and 3 overlapped drug-eluting stents to the right coronary artery.  Difficult procedure due to diffuse disease.    07/22/2024 Echo complete 1. Left ventricular ejection fraction, by estimation, is 50 to 55%. The left ventricle has low normal function. The left ventricle has no regional  wall motion abnormalities. There is moderate left ventricular hypertrophy. Left ventricular diastolic  parameters are consistent with Grade I diastolic dysfunction (impaired relaxation).   2. Right ventricular systolic function is normal. The right ventricular size is normal.   3. The mitral valve is normal in structure. Trivial mitral valve  regurgitation. No evidence of mitral stenosis.   4. The aortic valve is normal in structure. Aortic valve regurgitation is not visualized. No aortic  stenosis is present.   5. The inferior vena cava is normal in size with greater than 50% respiratory variability, suggesting right atrial pressure of 3 mmHg.   EKG:  EKG personally reviewed by me today    PHYSICAL EXAM:    VS:  BP (!) 140/80 (BP Location: Left Arm, Patient Position: Sitting, Cuff Size: Normal)   Pulse 69   Ht 5' 8 (1.727 m)   Wt 186 lb 2 oz (84.4 kg)   SpO2 97%   BMI 28.30 kg/m   BMI: Body mass index is 28.3 kg/m.  GEN: Well nourished, well developed in no acute distress NECK: No JVD; No carotid bruits CARDIAC: RRR, no murmurs, rubs, gallops RESPIRATORY:  Clear to auscultation without rales, wheezing or rhonchi  ABDOMEN: Soft, non-tender, non-distended EXTREMITIES: No edema; No deformity  Wt Readings from Last 3 Encounters:  08/01/24 186 lb 2 oz (84.4 kg)  07/21/24 183 lb (83 kg)  04/17/24 186 lb (84.4 kg)       ASSESSMENT & PLAN:   Coronary artery disease - Recent hospitalization 9/13-9/16 for NSTEMI with cath showing severe subocclusive RCA disease which was treated with PCI and 3 overlapping drug eluting stents. Echo showed EF 50-55%.  He is without symptoms of angina or cardiac decompensation.  Check CBC and BMP today. He is continued on DAPT with ASA 81 mg daily and Effient  10 mg daily for at least 12 months. Would ideally continue DAPT indefinitely as tolerated given overlapping stents. He is continued on atorvastatin  80 mg daily and carvedilol  3.125 mg twice daily.  Referral to cardiac rehab placed.  Patient will need to meet DOT requirements prior to returning to work.  Anticipate ordering ETT at follow-up if symptoms remain stable.  Diastolic heart failure Hypertension - Echo with low formal LV systolic function and G1DD. Cardiac catheterization showed mildly elevated LVEDP. BP remains elevated in office today. Increase Entresto  to 97-103 mg twice daily. Start empagliflozin  10 mg daily.  Continue carvedilol  as above.  Hyperlipidemia - Lipid panel  07/2024 with LDL 114. Continue atorvastatin  as above. Anticipate repeat lipid panel at follow up.   ?Sleep apnea - STOP-Bang at least 4. Apneic episodes in sleep noted during admission. Referral placed to pulmonology for sleep study.   T2DM - A1C 10.1. Managed by PCP. Started on empagliflozin  as above.    Disposition: Check CBC and  BMP.  Increase Entresto  to 97-103 mg twice daily.  Start empagliflozin  10 mg daily.  F/u with Dr. Darron or an APP in 1 month.   Medication Adjustments/Labs and Tests Ordered: Current medicines are reviewed at length with the patient today.  Concerns regarding medicines are outlined above. Medication changes, Labs and Tests ordered today are summarized above and listed in the Patient Instructions accessible in Encounters.   Bonney Lesley Maffucci, PA-C 08/01/2024 3:42 PM     [facility] [place] [address] [place], [place] [address] [phone]

## 2024-08-01 ENCOUNTER — Ambulatory Visit: Attending: Nurse Practitioner | Admitting: Physician Assistant

## 2024-08-01 ENCOUNTER — Encounter: Payer: Self-pay | Admitting: Nurse Practitioner

## 2024-08-01 VITALS — BP 140/80 | HR 69 | Ht 68.0 in | Wt 186.1 lb

## 2024-08-01 DIAGNOSIS — I5032 Chronic diastolic (congestive) heart failure: Secondary | ICD-10-CM | POA: Diagnosis not present

## 2024-08-01 DIAGNOSIS — E118 Type 2 diabetes mellitus with unspecified complications: Secondary | ICD-10-CM | POA: Diagnosis not present

## 2024-08-01 DIAGNOSIS — Z79899 Other long term (current) drug therapy: Secondary | ICD-10-CM

## 2024-08-01 DIAGNOSIS — R0681 Apnea, not elsewhere classified: Secondary | ICD-10-CM | POA: Diagnosis not present

## 2024-08-01 DIAGNOSIS — I2510 Atherosclerotic heart disease of native coronary artery without angina pectoris: Secondary | ICD-10-CM

## 2024-08-01 DIAGNOSIS — I10 Essential (primary) hypertension: Secondary | ICD-10-CM | POA: Diagnosis not present

## 2024-08-01 DIAGNOSIS — E785 Hyperlipidemia, unspecified: Secondary | ICD-10-CM

## 2024-08-01 MED ORDER — EMPAGLIFLOZIN 10 MG PO TABS: 10.0000 mg | ORAL_TABLET | Freq: Every day | ORAL | 2 refills | Status: DC

## 2024-08-01 MED ORDER — SACUBITRIL-VALSARTAN 97-103 MG PO TABS: 1.0000 | ORAL_TABLET | Freq: Two times a day (BID) | ORAL | 2 refills | Status: DC

## 2024-08-01 NOTE — Patient Instructions (Signed)
 Medication Instructions:  Your physician recommends the following medication changes.  START TAKING: Empagliflozin  (Jardiance ) 10 mg by mouth daily  INCREASE:  Entresto  (sacubitril /valsartan ) to 97-103 mg by mouth twice a day   *If you need a refill on your cardiac medications before your next appointment, please call your pharmacy*  Lab Work: Your provider would like for you to have following labs drawn today CBC, BMET.   If you have labs (blood work) drawn today and your tests are completely normal, you will receive your results only by: MyChart Message (if you have MyChart) OR A paper copy in the mail If you have any lab test that is abnormal or we need to change your treatment, we will call you to review the results.  Testing/Procedures: No test ordered today   Follow-Up:  A Referral order has been placed for Cardio/Pulmonary Rehab at [facility].  Contact # [phone]  At [facility], you and your health needs are our priority.  As part of our continuing mission to provide you with exceptional heart care, our providers are all part of one team.  This team includes your primary Cardiologist (physician) and Advanced Practice Providers or APPs (Physician Assistants and Nurse Practitioners) who all work together to provide you with the care you need, when you need it.  Your next appointment:   1 month(s)  Provider:  Lesley Maffucci, PA-C   We recommend signing up for the patient portal called MyChart.  Sign up information is provided on this After Visit Summary.  MyChart is used to connect with patients for Virtual Visits (Telemedicine).  Patients are able to view lab/test results, encounter notes, upcoming appointments, etc.  Non-urgent messages can be sent to your provider as well.   To learn more about what you can do with MyChart, go to [email or web address].

## 2024-08-02 LAB — BASIC METABOLIC PANEL WITH GFR
BUN/Creatinine Ratio: 19 (ref 10–24)
BUN: 17 mg/dL (ref 8–27)
CO2: 24 mmol/L (ref 20–29)
Calcium: 9.5 mg/dL (ref 8.6–10.2)
Chloride: 96 mmol/L (ref 96–106)
Creatinine, Ser: 0.9 mg/dL (ref 0.76–1.27)
Glucose: 181 mg/dL — ABNORMAL HIGH (ref 70–99)
Potassium: 4.5 mmol/L (ref 3.5–5.2)
Sodium: 136 mmol/L (ref 134–144)
eGFR: 97 mL/min/1.73 (ref >59)

## 2024-08-02 LAB — CBC
Hematocrit: 48.5 % (ref 37.5–51.0)
Hemoglobin: 15.8 g/dL (ref 13.0–17.7)
MCH: 27.2 pg (ref 26.6–33.0)
MCHC: 32.6 g/dL (ref 31.5–35.7)
MCV: 84 fL (ref 79–97)
Platelets: 214 x10E3/uL (ref 150–450)
RBC: 5.81 x10E6/uL — ABNORMAL HIGH (ref 4.14–5.80)
RDW: 13.4 % (ref 11.6–15.4)
WBC: 9.9 x10E3/uL (ref 3.4–10.8)

## 2024-08-05 ENCOUNTER — Ambulatory Visit: Payer: Self-pay | Admitting: Physician Assistant

## 2024-08-13 ENCOUNTER — Ambulatory Visit: Admitting: Sleep Medicine

## 2024-08-13 ENCOUNTER — Encounter: Payer: Self-pay | Admitting: Sleep Medicine

## 2024-08-13 VITALS — BP 120/80 | HR 65 | Temp 97.5°F | Ht 68.0 in | Wt 187.0 lb

## 2024-08-13 DIAGNOSIS — I10 Essential (primary) hypertension: Secondary | ICD-10-CM

## 2024-08-13 DIAGNOSIS — G4733 Obstructive sleep apnea (adult) (pediatric): Secondary | ICD-10-CM | POA: Diagnosis not present

## 2024-08-13 NOTE — Patient Instructions (Signed)
 Frank Miles

## 2024-08-13 NOTE — Progress Notes (Signed)
 Name:Frank Miles MRN: [id number] DOB: [birth year]   CHIEF COMPLAINT:  SNORING   HISTORY OF PRESENT ILLNESS: Frank Miles is a 63 y.o. w/ a h/o HTN and DMII who present for c/o intermittent snoring which has been present for several years. Denies any nocturnal awakenings. Reports a 40 lb weight loss over the last several months. Denies morning headaches, RLS symptoms, dream enactment, cataplexy, hypnagogic or hypnapompic hallucinations. Denies a family history of sleep apnea. Denies drowsy driving. Drinks soda and tea occasionally. Denies alcohol, tobacco or illicit drug use.   Bedtime 11 pm-12 am Sleep onset 5 mins Rise time 5 am   EPWORTH SLEEP SCORE 3    08/13/2024    8:00 AM  Results of the Epworth flowsheet  Sitting and reading 0  Watching TV 0  Sitting, inactive in a public place (e.g. a theatre or a meeting) 0  As a passenger in a car for an hour without a break 0  Lying down to rest in the afternoon when circumstances permit 2  Sitting and talking to someone 0  Sitting quietly after a lunch without alcohol 0  In a car, while stopped for a few minutes in traffic 1  Total score 3    PAST MEDICAL HISTORY :   has a past medical history of Anemia, CAD (coronary artery disease), GERD (gastroesophageal reflux disease), Hyperlipidemia LDL goal <55, Hypertension, LVH (left ventricular hypertrophy), Neuropathy, and Type 2 diabetes mellitus (HCC).  has a past surgical history that includes STENT PLACEMENT VASCULAR ([facility] HX) (07/23/2015); Esophagogastroduodenoscopy (egd) with propofol  (N/A, 03/15/2018); Colonoscopy with propofol  (N/A, 03/15/2018); LEFT HEART CATH AND CORONARY ANGIOGRAPHY (N/A, 07/22/2024); CORONARY STENT INTERVENTION (N/A, 07/22/2024); and CORONARY IMAGING/OCT (N/A, 07/22/2024). Prior to Admission medications   Medication Sig Start Date End Date Taking? Authorizing Provider  aspirin  81 MG tablet Take 81 mg by mouth daily.   Yes [provider]   atorvastatin  (LIPITOR ) 80 MG tablet Take 1 tablet (80 mg total) by mouth daily. 07/23/24 07/23/25 Yes Von Bellis, MD  carvedilol  (COREG ) 3.125 MG tablet Take 1 tablet (3.125 mg total) by mouth 2 (two) times daily with a meal. 07/23/24 07/23/25 Yes Von Bellis, MD  empagliflozin  (JARDIANCE ) 10 MG TABS tablet Take 1 tablet (10 mg total) by mouth daily before breakfast. 08/01/24  Yes Lorene Sinclair L, PA-C  insulin  aspart (NOVOLOG ) 100 UNIT/ML FlexPen Inject 2-15 Units into the skin 3 (three) times daily before meals. CBG 121 - 150: 2 units CBG 151 - 200: 3 units CBG 201 - 250: 5 units CBG 251 - 300: 8 units CBG 301 - 350: 11 units CBG 351 - 400: 15 units 07/23/24  Yes Von Bellis, MD  insulin  glargine (LANTUS ) 100 UNIT/ML injection Inject 12-16 Units into the skin at bedtime. Per BS reading   Yes [provider]  prasugrel  (EFFIENT ) 10 MG TABS tablet Take 1 tablet (10 mg total) by mouth daily. 07/24/24 07/24/25 Yes Von Bellis, MD  sacubitril -valsartan  (ENTRESTO ) 97-103 MG Take 1 tablet by mouth 2 (two) times daily. 08/01/24  Yes Lorene Sinclair L, PA-C  Blood Glucose Monitoring Suppl (ONETOUCH VERIO) w/Device KIT 1 Device by Does not apply route every morning. Patient not taking: Reported on 08/13/2024 02/06/20   Claudene Tanda POUR, PA-C  Blood Glucose Monitoring Suppl DEVI 1 each by Does not apply route in the morning, at noon, and at bedtime. May substitute to any manufacturer covered by patient's insurance. Patient not taking: Reported on 08/13/2024 06/26/23  Dartha Ernst, MD  glucose blood test strip Use as instructed Patient not taking: Reported on 08/13/2024 02/06/20   Claudene Tanda POUR, PA-C  Insulin  Pen Needle 32G X 4 MM MISC Use 1 each as directed. Patient not taking: Reported on 08/13/2024 07/23/24   Von Bellis, MD  Lancets [facility] ULTRASOFT) lancets Use as instructed Patient not taking: Reported on 08/13/2024 02/06/20   Claudene Tanda POUR, PA-C   No Known Allergies  FAMILY HISTORY:  family  history includes Alcohol abuse in his brother, brother, and father; Arthritis in his mother; Depression in his brother and sister; Diabetes in his mother; Heart attack in his brother; Heart disease in his mother; Lung cancer in his brother; Mental illness in his brother; Stomach cancer in his brother. SOCIAL HISTORY:  reports that he quit smoking about 7 years ago. His smoking use included cigarettes. He has never used smokeless tobacco. He reports that he does not currently use drugs. He reports that he does not drink alcohol.   Review of Systems:  Gen:  Denies  fever, sweats, chills weight loss  HEENT: Denies blurred vision, double vision, ear pain, eye pain, hearing loss, nose bleeds, sore throat Cardiac:  No dizziness, chest pain or heaviness, chest tightness,edema, No JVD Resp:   No cough, -sputum production, -shortness of breath,-wheezing, -hemoptysis,  Gi: Denies swallowing difficulty, stomach pain, nausea or vomiting, diarrhea, constipation, bowel incontinence Gu:  Denies bladder incontinence, burning urine Ext:   Denies Joint pain, stiffness or swelling Skin: Denies  skin rash, easy bruising or bleeding or hives Endoc:  Denies polyuria, polydipsia , polyphagia or weight change Psych:   Denies depression, insomnia or hallucinations  Other:  All other systems negative  VITAL SIGNS: BP 120/80   Pulse 65   Temp (!) 97.5 F (36.4 C)   Ht 5' 8 (1.727 m)   Wt 187 lb (84.8 kg)   SpO2 99%   BMI 28.43 kg/m    Physical Examination:   General Appearance: No distress  EYES PERRLA, EOM intact.   NECK Supple, No JVD Pulmonary: normal breath sounds, No wheezing.  CardiovascularNormal S1,S2.  No m/r/g.   Abdomen: Benign, Soft, non-tender. Skin:   warm, no rashes, no ecchymosis  Extremities: normal, no cyanosis, clubbing. Neuro:without focal findings,  speech normal  PSYCHIATRIC: Mood, affect within normal limits.   ASSESSMENT AND PLAN  OSA I suspect that OSA is likely present  due to clinical presentation. Discussed the consequences of untreated sleep apnea. Advised not to drive drowsy for safety of patient and others. Will complete further evaluation with a home sleep study and follow up to review results.    HTN Stable, on current management. Following with PCP.    MEDICATION ADJUSTMENTS/LABS AND TESTS ORDERED: Recommend Sleep Study   Patient  satisfied with Plan of action and management. All questions answered  Follow up to review HST results and treatment plan.   I spent a total of 34 minutes reviewing chart data, face-to-face evaluation with the patient, counseling and coordination of care as detailed above.    Anita Laguna, M.D.  Sleep Medicine [facility]

## 2024-08-22 ENCOUNTER — Other Ambulatory Visit: Payer: Self-pay

## 2024-09-18 NOTE — Progress Notes (Signed)
 Cardiology Office Note    Date:  09/19/2024   ID:  Frutoso Dimare, DOB 1961/01/16, MRN [id number]  PCP:  Claudene Tanda POUR, PA-C  Cardiologist:  Deatrice Cage, MD  Electrophysiologist:  None   Chief Complaint: Follow up  History of Present Illness:   Mckinnon Glick is a 63 y.o. male with history of CAD, hypertension, hyperlipidemia, LVH, poorly controlled diabetes, GERD, and neuropathy who presents for follow up on CAD.     Patient was previously admitted to [facility] in 07/2015 in the setting of NSTEMI. Echo showed EF 65-70% with moderate LVH and normal RV function. Diagnostic catheterization revealed mild diffuse LAD disease, moderate RCA disease, and a 95% stenosis in the first obtuse marginal, which was felt to be the culprit vessel and successfully stented with 3.5 x 28 mm Promus DES. He was lost to follow up with cardiology.   Patient presented to [facility] ED on 07/19/2024 with complaints of substernal chest heaviness, pressure, and burning. He originally thought a piece of food had stuck in his throat. However, he became diaphoretic and did not have relief with TUMs which prompted him to report to the ED. Initial BP was markedly elevated. EKG was without acute ischemic changes. Troponin peaked at 9333. Echo showed EF 50-55% with G1DD. Cardiac catheterization revealed patent LCx stent with minimal restenosis and severe subocclusive disease involving the RCA which was treated with PCI and 3 overlapping drug eluding stents. It was recommended that patient continue aspirin  and prasugrel  for a minimum of 12 months but preferably long term if tolerated given multiple overlapping stents. He was started on Entresto  for blood pressure control in addition to carvedilol . Patient was noted to have apneic episodes in his sleep during admission. He was discharged home in stable condition on 9/16.    Patient was most recently seen by myself for hospital follow up on 08/01/2024 and overall doing well  from a cardiac perspective.  He reported working on eating healthier focusing on increasing intake of lean meats and vegetables and reducing intake of breads and potatoes.  He denied further chest pain since hospital discharge.  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  Entresto  was increased and he was started on empagliflozin .  Referral was placed to pulmonology for sleep study.  Patient presents today overall doing well from a cardiac perspective without symptoms of angina or cardiac decompensation.  He reports staying active on a daily basis.  He has a stationary bike and Bowflex at home which he uses a few times a week.  He would like to use this more and start walking or jogging on a regular basis.  He has been tolerating exercise well without any symptoms of exertional angina or dyspnea.  Blood pressure noted to be elevated in office today.  Patient reports he took his medicine just before leaving to come to his appointment.  He does not check his blood pressure at home.  He denies lightheadedness, dizziness, palpitations, and lower extremity swelling.  No bleeding or hematochezia.  Labs independently reviewed: 08/01/2024-BUN 17, creatinine 0.9, sodium 136, potassium 4.5, Hgb 15.8, HCT 48.5, platelets 214 07/22/2024- A1C 10.1, TC 179, TG 85, HDL 48, LDL 114   Objective   Past Medical History:  Diagnosis Date   Anemia    CAD (coronary artery disease)    a. 07/2015 NSTEMI/PCI [facility]): LM nl, LAD mild diff dzs  up to 25%, D1-6 nl, LCX min irregs, OM1 95 (3.5x28 Promus DES), RCA 50p/m, RPDA/PL nl.   GERD (gastroesophageal reflux disease)    Hyperlipidemia LDL goal <55    Hypertension    LVH (left ventricular hypertrophy)    a. 07/2015 Echo: EF 65-70%, mod LVH, nl RV fxn.   Neuropathy    Type 2 diabetes mellitus (HCC)    a. 04/2024 HbA1c 15.0.    Current Medications: Current Meds  Medication Sig   aspirin  81  MG tablet Take 81 mg by mouth daily.   atorvastatin  (LIPITOR ) 80 MG tablet Take 1 tablet (80 mg total) by mouth daily.   Blood Glucose Monitoring Suppl (ONETOUCH VERIO) w/Device KIT 1 Device by Does not apply route every morning.   Blood Glucose Monitoring Suppl DEVI 1 each by Does not apply route in the morning, at noon, and at bedtime. May substitute to any manufacturer covered by patient's insurance.   carvedilol  (COREG ) 3.125 MG tablet Take 1 tablet (3.125 mg total) by mouth 2 (two) times daily with a meal.   glucose blood test strip Use as instructed   insulin  aspart (NOVOLOG ) 100 UNIT/ML FlexPen Inject 2-15 Units into the skin 3 (three) times daily before meals. CBG 121 - 150: 2 units CBG 151 - 200: 3 units CBG 201 - 250: 5 units CBG 251 - 300: 8 units CBG 301 - 350: 11 units CBG 351 - 400: 15 units   insulin  glargine (LANTUS ) 100 UNIT/ML injection Inject 12-16 Units into the skin at bedtime. Per BS reading   Insulin  Pen Needle 32G X 4 MM MISC Use 1 each as directed.   Lancets (ONETOUCH ULTRASOFT) lancets Use as instructed (Patient taking differently: 1 each by Other route at bedtime. Use as instructed)   prasugrel  (EFFIENT ) 10 MG TABS tablet Take 1 tablet (10 mg total) by mouth daily.   sacubitril -valsartan  (ENTRESTO ) 97-103 MG Take 1 tablet by mouth 2 (two) times daily.   [DISCONTINUED] empagliflozin  (JARDIANCE ) 10 MG TABS tablet Take 1 tablet (10 mg total) by mouth daily before breakfast.    Allergies:   Patient has no known allergies.   Social History   Socioeconomic History   Marital status: Divorced    Spouse name: Not on file   Number of children: Not on file   Years of education: Not on file   Highest education level: Not on file  Occupational History   Not on file  Tobacco Use   Smoking status: Former    Current packs/day: 0.00    Types: Cigarettes    Quit date: 10/07/2016    Years since quitting: 7.9   Smokeless tobacco: Never  Vaping Use   Vaping status: Never Used   Substance and Sexual Activity   Alcohol use: No   Drug use: Not Currently   Sexual activity: Yes  Other Topics Concern   Not on file  Social History Narrative   Lives alone (divorced 2013). 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   Social Drivers of Corporate Investment Banker Strain: Low Risk  (05/08/2024)   Received from [facility]   Overall Financial Resource Strain (CARDIA)    Difficulty of Paying Living Expenses: Not hard at all  Food Insecurity: No Food Insecurity (07/22/2024)   Hunger Vital Sign    Worried About Running Out of Food in the Last Year: Never true    Ran Out  of Food in the Last Year: Never true  Transportation Needs: No Transportation Needs (07/22/2024)   PRAPARE - Administrator, Civil Service (Medical): No    Lack of Transportation (Non-Medical): No  Physical Activity: Not on file  Stress: Not on file  Social Connections: Not on file     Family History:  The patient's family history includes Alcohol abuse in his brother, brother, and father; Arthritis in his mother; Depression in his brother and sister; Diabetes in his mother; Heart attack in his brother; Heart disease in his mother; Lung cancer in his brother; Mental illness in his brother; Stomach cancer in his brother.  ROS:   12-point review of systems is negative unless otherwise noted in the HPI.  EKGs/Other Studies Reviewed:    Studies reviewed were summarized above. The additional studies were reviewed today:  07/22/2024 LHC 1.  Patent left circumflex stent with mild in-stent restenosis.  Occluded apical LAD with collaterals and significant disease in distal diagonal branch with trace to small to stent.  Severe stenosis in the mid right coronary artery is the likely culprit and the whole vessel is diffusely diseased from the proximal to the distal segment. 2.  Left ventricular angiography was not  performed.  EF was low normal by echo.  Mildly elevated left ventricular end-diastolic pressure. 3.  Successful OCT guided angioplasty and 3 overlapped drug-eluting stents to the right coronary artery.  Difficult procedure due to diffuse disease.    07/22/2024 Echo complete 1. Left ventricular ejection fraction, by estimation, is 50 to 55%. The left ventricle has low normal function. The left ventricle has no regional  wall motion abnormalities. There is moderate left ventricular hypertrophy. Left ventricular diastolic  parameters are consistent with Grade I diastolic dysfunction (impaired relaxation).   2. Right ventricular systolic function is normal. The right ventricular size is normal.   3. The mitral valve is normal in structure. Trivial mitral valve  regurgitation. No evidence of mitral stenosis.   4. The aortic valve is normal in structure. Aortic valve regurgitation is not visualized. No aortic stenosis is present.   5. The inferior vena cava is normal in size with greater than 50% respiratory variability, suggesting right atrial pressure of 3 mmHg.   EKG:  EKG personally reviewed by me today EKG Interpretation Date/Time:  Thursday September 19 2024 08:25:02 EST Ventricular Rate:  74 PR Interval:  196 QRS Duration:  112 QT Interval:  390 QTC Calculation: 432 R Axis:   -51  Text Interpretation: Normal sinus rhythm Possible Left atrial enlargement Incomplete right bundle branch block Left anterior fascicular block Minimal voltage criteria for LVH, may be normal variant ( Cornell product ) When compared with ECG of 19-Sep-2024 08:16, No significant change was found Confirmed by Lorene Sinclair ([id number]) on 09/19/2024 8:28:02 AM  PHYSICAL EXAM:    VS:  BP (!) 182/78 (BP Location: Left Arm, Patient Position: Sitting, Cuff Size: Normal)   Pulse 68 Comment: 65oximeter  Ht 5' 8 (1.727 m)   Wt 192 lb 9.6 oz (87.4 kg)   SpO2 99%   BMI 29.28 kg/m   BMI: Body mass index is 29.28  kg/m.  GEN: Well nourished, well developed in no acute distress NECK: No JVD; No carotid bruits CARDIAC: RRR, no murmurs, rubs, gallops RESPIRATORY:  Clear to auscultation without rales, wheezing or rhonchi  ABDOMEN: Soft, non-tender, non-distended EXTREMITIES: No edema; No deformity  Wt Readings from Last 3 Encounters:  09/19/24 192 lb 9.6 oz (87.4 kg)  08/13/24 187 lb (84.8 kg)  08/01/24 186 lb 2 oz (84.4 kg)           STOP-Bang Score:  4         ASSESSMENT & PLAN:   Coronary artery disease - S/p NSTEMI 07/20/2024 s/p 3 overlapping DES to the RCA.  Echo with EF 50 to 55%.  No symptoms of angina or cardiac decompensation.  He is continued on DAPT with aspirin  81 mg daily and Effient  10 mg daily for at least 12 months, but ideally indefinitely giving overlapping stents.  He is continued on atorvastatin  80 mg daily and carvedilol  (see below).  Will order ETT today to fulfill DOT requirements prior to returning to work. He plans to drop off paperwork to be signed pending results.   Diastolic heart failure Hypertension - Echo with low normal LV systolic function and G1 DD.  Cardiac catheterization showed mildly elevated LVEDP.  BP elevated today.  He reports taking his medication just prior to coming to his appointment today.  Of note, prior reading 10/7 was within normal limits.  He is continued on Entresto  97-103 mg twice daily and empagliflozin  10 mg daily.  Increase carvedilol  to 6.25 mg twice daily. Recommend monitoring BP at home.  Hyperlipidemia - Lipid panel 07/2024 with LDL 114, goal < 55.  Continue atorvastatin  as above.  Update lipid panel and BMP today.  ?  Sleep apnea - STOP-BANG score at least 4 with apneic episodes and sleep noted during hospital admission.  He saw pulmonology 10/7 and home sleep study was ordered.  Type 2 diabetes - A1c 10.1.  Managed by PCP.   Disposition: Check BMP and lipid panel.  ETT ordered to eval DOT requirements.  Increase carvedilol  to  6.25 mg twice daily.  F/u with Dr. Darron or an APP in 1 month.   Medication Adjustments/Labs and Tests Ordered: Current medicines are reviewed at length with the patient today.  Concerns regarding medicines are outlined above. Medication changes, Labs and Tests ordered today are summarized above and listed in the Patient Instructions accessible in Encounters.   Bonney Lesley Maffucci, PA-C 09/19/2024 8:53 AM     [facility] [place] [address] [place], [place] [address] [phone]

## 2024-09-19 ENCOUNTER — Telehealth: Payer: Self-pay | Admitting: Physician Assistant

## 2024-09-19 ENCOUNTER — Telehealth: Payer: Self-pay | Admitting: Pharmacy Technician

## 2024-09-19 ENCOUNTER — Ambulatory Visit: Attending: Physician Assistant | Admitting: Physician Assistant

## 2024-09-19 ENCOUNTER — Encounter: Payer: Self-pay | Admitting: Physician Assistant

## 2024-09-19 ENCOUNTER — Other Ambulatory Visit (HOSPITAL_COMMUNITY): Payer: Self-pay

## 2024-09-19 VITALS — BP 182/78 | HR 68 | Ht 68.0 in | Wt 192.6 lb

## 2024-09-19 DIAGNOSIS — I2510 Atherosclerotic heart disease of native coronary artery without angina pectoris: Secondary | ICD-10-CM | POA: Diagnosis not present

## 2024-09-19 DIAGNOSIS — I5032 Chronic diastolic (congestive) heart failure: Secondary | ICD-10-CM | POA: Diagnosis not present

## 2024-09-19 DIAGNOSIS — R0681 Apnea, not elsewhere classified: Secondary | ICD-10-CM

## 2024-09-19 DIAGNOSIS — E785 Hyperlipidemia, unspecified: Secondary | ICD-10-CM

## 2024-09-19 DIAGNOSIS — I10 Essential (primary) hypertension: Secondary | ICD-10-CM

## 2024-09-19 DIAGNOSIS — E118 Type 2 diabetes mellitus with unspecified complications: Secondary | ICD-10-CM | POA: Diagnosis not present

## 2024-09-19 MED ORDER — CARVEDILOL 6.25 MG PO TABS: 6.2500 mg | ORAL_TABLET | Freq: Two times a day (BID) | ORAL | 3 refills | Status: AC

## 2024-09-19 MED ORDER — EMPAGLIFLOZIN 10 MG PO TABS: 10.0000 mg | ORAL_TABLET | Freq: Every day | ORAL | 2 refills | Status: AC

## 2024-09-19 NOTE — Telephone Encounter (Signed)
    Jardiance  coupon   Ran test claim for farxiga. For a 30 day supply and the co-pay is 0.00 . PA is not needed at this time. Nothing saying this is a transition fill.  I called [facility] and they said the patient picked up the jardiance  today and it was free

## 2024-09-19 NOTE — Patient Instructions (Signed)
 Medication Instructions:  Your physician recommends the following medication changes.  INCREASE: Carvedilol  6.25 mg 2 Times daily  *If you need a refill on your cardiac medications before your next appointment, please call your pharmacy*  Lab Work: Your provider would like for you to have following labs drawn today Bmet, Lipid panel.   If you have labs (blood work) drawn today and your tests are completely normal, you will receive your results only by: MyChart Message (if you have MyChart) OR A paper copy in the mail If you have any lab test that is abnormal or we need to change your treatment, we will call you to review the results.  Testing/Procedures: Your provider has ordered a exercise tolerance test. This test will evaluate the blood supply to your heart muscle during periods of exercise and rest. For this test, you will raise your heart rate by walking on a treadmill at different levels.   you may eat a light breakfast/ lunch prior to your procedure no caffeine for 24 hours prior to your test (coffee, tea, soft drinks, or chocolate)  no smoking/ vaping for 4 hours prior to your test you may take your regular medications the day of your test except for:   - hold CARVEDILOL  24 HOURS bring any inhalers with you to your test wear comfortable clothing & tennis/ non-skid shoes to walk on the treadmill  This will take place at [address] [facility])  [place] [address]   Follow-Up: At [facility], you and your health needs are our priority.  As part of our continuing mission to provide you with exceptional heart care, our providers are all part of one team.  This team includes your primary Cardiologist (physician) and Advanced Practice Providers or APPs (Physician Assistants and Nurse Practitioners) who all work together to provide you with the care you need, when you need it.  Your next appointment:   1 month(s)  Provider:   You will see one of the  following Advanced Practice Providers on your designated Care Team:   Lonni Meager, NP Lesley Maffucci, PA-C Bernardino Bring, PA-C Cad[employer], PA-C Tylene Lunch, NP Barnie Hila, NP

## 2024-09-19 NOTE — Telephone Encounter (Signed)
 Patient dropped off Dot form, gave to Jeannie

## 2024-09-20 ENCOUNTER — Ambulatory Visit: Payer: Self-pay | Admitting: Physician Assistant

## 2024-09-20 LAB — BASIC METABOLIC PANEL WITH GFR
BUN/Creatinine Ratio: 21 (ref 10–24)
BUN: 19 mg/dL (ref 8–27)
CO2: 25 mmol/L (ref 20–29)
Calcium: 9.6 mg/dL (ref 8.6–10.2)
Chloride: 100 mmol/L (ref 96–106)
Creatinine, Ser: 0.9 mg/dL (ref 0.76–1.27)
Glucose: 178 mg/dL — ABNORMAL HIGH (ref 70–99)
Potassium: 4.1 mmol/L (ref 3.5–5.2)
Sodium: 139 mmol/L (ref 134–144)
eGFR: 97 mL/min/1.73 (ref >59)

## 2024-09-20 LAB — LIPID PANEL
Chol/HDL Ratio: 2.4 ratio (ref 0.0–5.0)
Cholesterol, Total: 105 mg/dL (ref 100–199)
HDL: 44 mg/dL (ref >39)
LDL Chol Calc (NIH): 45 mg/dL (ref 0–99)
Triglycerides: 76 mg/dL (ref 0–149)
VLDL Cholesterol Cal: 16 mg/dL (ref 5–40)

## 2024-09-23 ENCOUNTER — Other Ambulatory Visit: Payer: Self-pay

## 2024-09-23 ENCOUNTER — Ambulatory Visit
Admission: RE | Admit: 2024-09-23 | Discharge: 2024-09-23 | Disposition: A | Discharge status: Home / Self Care | Source: Ambulatory Visit | Attending: Physician Assistant | Admitting: Physician Assistant

## 2024-09-23 DIAGNOSIS — I110 Hypertensive heart disease with heart failure: Secondary | ICD-10-CM | POA: Insufficient documentation

## 2024-09-23 DIAGNOSIS — I5032 Chronic diastolic (congestive) heart failure: Secondary | ICD-10-CM | POA: Diagnosis not present

## 2024-09-23 DIAGNOSIS — I10 Essential (primary) hypertension: Secondary | ICD-10-CM | POA: Diagnosis not present

## 2024-09-23 DIAGNOSIS — E118 Type 2 diabetes mellitus with unspecified complications: Secondary | ICD-10-CM | POA: Diagnosis not present

## 2024-09-23 DIAGNOSIS — I2510 Atherosclerotic heart disease of native coronary artery without angina pectoris: Secondary | ICD-10-CM | POA: Insufficient documentation

## 2024-09-23 NOTE — Progress Notes (Signed)
 Order(s) created erroneously. Erroneous order ID: [id number]  Order moved by: CHART CORRECTION ANALYST NINE, IDENTITY  Order move date/time: 09/23/2024 8:16 AM  Source Patient: [id number]  Source Contact: 09/19/2024  Destination Patient: [id number]  Destination Contact: 04/19/2023

## 2024-09-25 ENCOUNTER — Ambulatory Visit
Admission: RE | Admit: 2024-09-25 | Discharge: 2024-09-25 | Disposition: A | Discharge status: Home / Self Care | Source: Ambulatory Visit | Attending: Physician Assistant | Admitting: Physician Assistant

## 2024-09-25 DIAGNOSIS — I5032 Chronic diastolic (congestive) heart failure: Secondary | ICD-10-CM | POA: Diagnosis not present

## 2024-09-25 DIAGNOSIS — R030 Elevated blood-pressure reading, without diagnosis of hypertension: Secondary | ICD-10-CM | POA: Insufficient documentation

## 2024-09-25 DIAGNOSIS — I2510 Atherosclerotic heart disease of native coronary artery without angina pectoris: Secondary | ICD-10-CM | POA: Diagnosis not present

## 2024-09-25 LAB — EXERCISE TOLERANCE TEST
Angina Index: 0
Estimated workload: 8.5
Exercise duration (min): 6 min
Exercise duration (sec): 59 s
MPHR: 158 {beats}/min
Peak HR: 142 {beats}/min
Percent HR: 89 %
Rest HR: 71 {beats}/min

## 2024-09-26 ENCOUNTER — Telehealth: Payer: Self-pay | Admitting: Cardiovascular Disease

## 2024-09-26 ENCOUNTER — Ambulatory Visit: Payer: Self-pay | Admitting: Physician Assistant

## 2024-09-26 NOTE — Telephone Encounter (Signed)
 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 Pt verbalized understanding.

## 2024-09-26 NOTE — Telephone Encounter (Signed)
 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

## 2024-09-26 NOTE — Telephone Encounter (Signed)
 Patient came by office to pick up paperwork Informed patient Frank Miles states that the paperwork dropped off was incorrect and not something that we can sign Told patient that we will sign once we receive correct papers Nothing further needed at this time

## 2024-09-27 ENCOUNTER — Other Ambulatory Visit: Payer: Self-pay | Admitting: Physician Assistant

## 2024-09-27 NOTE — Progress Notes (Signed)
 Return to work letter faxed to [facility] at their request.

## 2024-10-08 ENCOUNTER — Ambulatory Visit: Payer: Self-pay | Admitting: Physician Assistant

## 2024-10-08 ENCOUNTER — Encounter: Payer: Self-pay | Admitting: Physician Assistant

## 2024-10-08 VITALS — BP 179/83 | HR 86 | Temp 97.5°F | Resp 16 | Ht 68.0 in | Wt 190.0 lb

## 2024-10-08 DIAGNOSIS — E0841 Diabetes mellitus due to underlying condition with diabetic mononeuropathy: Secondary | ICD-10-CM

## 2024-10-08 MED ORDER — GABAPENTIN 100 MG PO CAPS: 100.0000 mg | ORAL_CAPSULE | Freq: Three times a day (TID) | ORAL | 0 refills | Status: DC

## 2024-10-08 NOTE — Progress Notes (Signed)
   Subjective: Burning sensation bilateral lower extremities    Patient ID: Frank Miles, male    DOB: [birth year], 63 y.o.   MRN: [id number]  HPI Patient complain of burning sensation bilateral lower extremities for few months.  He is a type I diabetic with poor control.  Last hemoglobin A1c was 15.  Patient is taking Lantus  at 12 units nightly.  Patient advised to increase by 1 to 2 units and follow-up in 2 months.  Patient has not followed up with endocrinologist.   Review of Systems Diabetes, GERD, hyperlipidemia, and hypertension.    Objective:   Physical Exam BP 179/83  BP Location Left Arm  Patient Position Sitting  Cuff Size Large  Pulse Rate 86  Temp 97.5 F (36.4 C)  Temp Source Temporal  Weight 190 lb (86.2 kg)  Height 5' 8 (1.727 m)  Resp 16  SpO2 95 %   BMI: 28.89 kg/m2  BSA: 2.03 m2  Physical exam deferred secondary to patient shows me he is going to follow-up with his endocrinologist.       Assessment & Plan: Diabetic neuropathy  Advised patient continue previous medications.  Will start him on gabapentin 100 mg 3 times daily.  Will titrate if he can tolerate the medication on his next visit.  Follow-up in 1 month.

## 2024-10-08 NOTE — Progress Notes (Signed)
 S/Sx started this past Thursday/Friday: Sinus drainage Productive cough - has coughed up blood tinged phlegm (Concerned about the blood - S/P MI & Stent placement) Fever on Saturday night- Had chills but didn't check with thermometer - Resolved Vomited x1 when running fever - Resolved  Denies headache, facial pain/pressure, ear discomfort, teeth discomfort; SOB; Wheezing  Has been taking OTC Coricidin products (for high blood pressure)    ? About need for a Neurology referral. Has been experiencing burning sensation in left leg & in toes on both feet. Burning sensation has been going on for awhile.

## 2024-10-10 NOTE — Telephone Encounter (Signed)
 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

## 2024-10-10 NOTE — Telephone Encounter (Signed)
 Pt returning call. HE states other office has not received a fax. Please advise.

## 2024-10-10 NOTE — Telephone Encounter (Signed)
 Called patient to tell him that Lesley Maffucci, PA-C had faxed those forms over to the occupational health clinic last week to release him to go back to work. Patient stated he would call them to follow up and call back if he needed anything else.

## 2024-10-15 ENCOUNTER — Other Ambulatory Visit: Payer: Self-pay | Admitting: Physician Assistant

## 2024-10-15 NOTE — Progress Notes (Unsigned)
 Called Tuesday (10/15/24) C/O sore throat for over a week. States feels swollen & having some difficulty swallowing. Appt already scheduled for Wednesday (10/16/2024) & advised will do POC Strep Test.

## 2024-10-16 ENCOUNTER — Ambulatory Visit: Payer: Self-pay | Admitting: Physician Assistant

## 2024-10-16 ENCOUNTER — Encounter: Payer: Self-pay | Admitting: Physician Assistant

## 2024-10-16 VITALS — BP 150/84 | HR 64 | Temp 97.1°F | Resp 14 | Ht 68.0 in | Wt 188.0 lb

## 2024-10-16 DIAGNOSIS — Z024 Encounter for examination for driving license: Secondary | ICD-10-CM

## 2024-10-16 DIAGNOSIS — J029 Acute pharyngitis, unspecified: Secondary | ICD-10-CM

## 2024-10-16 LAB — POCT URINE DIPSTICK
Bilirubin, UA: NEGATIVE
Blood, UA: NEGATIVE
Glucose, UA: >=1000 mg/dL — AB
Ketones, POC UA: NEGATIVE mg/dL
Leukocytes, UA: NEGATIVE
Nitrite, UA: NEGATIVE
Spec Grav, UA: 1.02 (ref 1.010–1.025)
Urobilinogen, UA: 0.2 U/dL
pH, UA: 6 (ref 5.0–8.0)

## 2024-10-16 LAB — POCT RAPID STREP A (OFFICE): Rapid Strep A Screen: POSITIVE — AB

## 2024-10-16 MED ORDER — AMOXICILLIN 875 MG PO TABS: 875.0000 mg | ORAL_TABLET | Freq: Two times a day (BID) | ORAL | 0 refills | Status: AC

## 2024-10-16 NOTE — Progress Notes (Signed)
° °  Subjective: DOT recertification    Patient ID: Frank Miles, male    DOB: 1961-06-15, 63 y.o.   MRN: [id number]  HPI Patient presents for DOT recertification.  Patient suffered a MI in September 2025.  Patient was admitted and followed by cardiology.  Patient was cleared by cardiology November 2025.  Patient presents today for recertification.   Review of Systems Diabetes, GERD, hyperlipidemia, and hypertension.    Objective:   Physical Exam BP 150/84  BP Location Left Arm  Patient Position Sitting  Cuff Size Large  Pulse Rate 64  Temp 97.1 F (36.2 C)  Temp Source Temporal  Weight 188 lb (85.3 kg)  Height 5' 8 (1.727 m)  Resp 14  SpO2 96 %   BMI: 28.59 kg/m2  BSA: 2.02 m2  HEENT is unremarkable.  Passed whisper test.  Neck is supple for lymphadenopathy or bruits.   Lungs are clear to auscultation.  Heart is regular rate and rhythm.  Blood pressure slightly elevated. Abdomen with negative chest pain, normoactive bowel sounds, soft, nontender palpation. No obvious deformity to the cervical or lumbar spine.  Patient has full and equal range of motion of the cervical and lumbar spine. No obvious deformity to the upper or lower extremities.  Patient has full and equal range of motion of the upper and lower extremities. Cranial nerves II through XII are grossly intact.       Assessment & Plan: DOT recertification.  Patient given 1 year certification due to medical history of MI and hypertension.

## 2024-10-22 NOTE — Progress Notes (Unsigned)
 Cardiology Office Note    Date:  10/22/2024   ID:  Frank Miles, DOB 1961/10/09, MRN [id number]  PCP:  Claudene Tanda POUR, PA-C  Cardiologist:  Deatrice Cage, MD  Electrophysiologist:  None   Chief Complaint: ***  History of Present Illness:   Frank Miles is a 63 y.o. male with history of CAD, hypertension, hyperlipidemia, LVH, poorly controlled diabetes, GERD, and neuropathy who presents for***.    Patient was previously admitted to [facility] in 07/2015 in the setting of NSTEMI. Echo showed EF 65-70% with moderate LVH and normal RV function. Diagnostic catheterization revealed mild diffuse LAD disease, moderate RCA disease, and a 95% stenosis in the first obtuse marginal, which was felt to be the culprit vessel and successfully stented with 3.5 x 28 mm Promus DES. He was lost to follow up with cardiology.   Patient presented to [facility] ED on 07/19/2024 with complaints of substernal chest heaviness, pressure neuropathy burning. He originally thought a piece of food had stuck in his throat. However, he became diaphoretic and did not have relief with TUMs which prompted him to report to the ED. Initial BP was markedly elevated. EKG was without acute ischemic changes. Troponin peaked at 9333. Echo showed EF 50-55% with G1DD. Cardiac catheterization revealed patent LCx stent with minimal restenosis and severe subocclusive disease involving the RCA which was treated with PCI and 3 overlapping drug eluding stents. It was recommended that patient continue aspirin  and prasugrel  for a minimum of 12 months but preferably long term if tolerated given multiple overlapping stents. He was started on Entresto  for blood pressure control in addition to carvedilol . Patient was noted to have apneic episodes in his sleep during admission. He was discharged home in stable condition on 9/16.   Patient was seen for hospital follow-up 08/01/2024 overall doing well without further chest pain since hospital  discharge.  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  Entresto  was increased and he was started on low flow send.  Referral was placed to pulmonology for sleep study.   Patient was most recently seen by myself 09/19/2024 overall doing well without symptoms of angina or cardiac decompensation.  Reported staying active on a daily basis using a stationary bike and Bowflex at home.  He was tolerating exercise well without any symptoms of exertional angina or dyspnea.  Blood pressure was noted to be elevated.  Patient reported taking his medicine just before leaving for that appointment.  Carvedilol  was increased to 6.25 mg twice daily.  He completed pleated exercise tolerance test 09/25/2024 which was overall low risk.  Marked hypertensive response was noted likely due to deconditioning.  ***  Labs independently reviewed: 09/2024-TC 105, TG 76, HDL 44, LDL 45, BUN 19, creatinine 0.9, sodium 139, potassium 4.1 07/2024-Hgb 15.8, HCT 48.5, platelets 214  Objective   Past Medical History:  Diagnosis Date   Anemia    CAD (coronary artery disease)    a. 07/2015 NSTEMI/PCI [facility]): LM nl, LAD mild diff dzs up to 25%, D1-6 nl, LCX min irregs, OM1 95 (3.5x28 Promus DES), RCA 50p/m, RPDA/PL nl.   GERD (gastroesophageal reflux disease)    Hyperlipidemia LDL goal <55    Hypertension    LVH (left ventricular hypertrophy)    a. 07/2015 Echo: EF 65-70%, mod LVH, nl RV fxn.   Neuropathy    Type 2 diabetes mellitus (HCC)    a. 04/2024 HbA1c 15.0.    Current Medications:  Active Medications[1]  Allergies:   Patient has no known allergies.   Social History   Socioeconomic History   Marital status: Divorced    Spouse name: Not on file   Number of children: Not on file   Years of education: Not on file   Highest education level: Not on file  Occupational History   Not on file  Tobacco Use   Smoking status: Former    Current packs/day: 0.00    Average packs/day:  0.3 packs/day    Types: Cigarettes    Quit date: 10/07/2016    Years since quitting: 8.0   Smokeless tobacco: Never  Vaping Use   Vaping status: Never Used  Substance and Sexual Activity   Alcohol use: No   Drug use: Not Currently   Sexual activity: Yes  Other Topics Concern   Not on file  Social History Narrative   Lives alone (divorced 2013). 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   Social Drivers of Health   Tobacco Use: Medium Risk (10/16/2024)   Patient History    Smoking Tobacco Use: Former    Smokeless Tobacco Use: Never    Passive Exposure: Not on file  Financial Resource Strain: Low Risk  (05/08/2024)   Received from [facility]   Overall Financial Resource Strain (CARDIA)    Difficulty of Paying Living Expenses: Not hard at all  Food Insecurity: No Food Insecurity (07/22/2024)   Epic    Worried About Programme Researcher, Broadcasting/film/video in the Last Year: Never true    Ran Out of Food in the Last Year: Never true  Transportation Needs: No Transportation Needs (07/22/2024)   Epic    Lack of Transportation (Medical): No    Lack of Transportation (Non-Medical): No  Physical Activity: Not on file  Stress: Not on file  Social Connections: Not on file  Depression (EYV7-0): Not on file  Alcohol Screen: Not on file  Housing: Low Risk (07/22/2024)   Epic    Unable to Pay for Housing in the Last Year: No    Number of Times Moved in the Last Year: 0    Homeless in the Last Year: No  Utilities: Not At Risk (07/22/2024)   Epic    Threatened with loss of utilities: No  Health Literacy: Not on file     Family History:  The patient's family history includes Alcohol abuse in his brother, brother, and father; Arthritis in his mother; Depression in his brother and sister; Diabetes in his mother; Heart attack in his brother; Heart disease in his mother; Lung cancer in his brother; Mental illness in his  brother; Stomach cancer in his brother.  ROS:   12-point review of systems is negative unless otherwise noted in the HPI.  EKGs/Other Studies Reviewed:    Studies reviewed were summarized above. The additional studies were reviewed today:  09/25/2024 ETT   Baseline ECG demonstrates normal sinus rhythm with rightward axis, inferior T wave inversions, and nonspecific ST segment abnormalities.   The patient demonstrates normal functional capacity.  Markedly hypertensive blood pressure response was noted (max BP 269/72).  No angina was reported.   No significant ST segment abnormalities occurred during stress.  Pseudonormalization of baseline inferior T wave inversions occurred.   No significant arrhythmia was observed.   Low risk exercise tolerance test (Duke Treadmill Score = +7).   Low risk exercise tolerance test without evidence of inducible  ischemia at workload achieved.  Markedly hypertensive blood pressure response noted.  07/22/2024 LHC 1.  Patent left circumflex stent with mild in-stent restenosis.  Occluded apical LAD with collaterals and significant disease in distal diagonal branch with trace to small to stent.  Severe stenosis in the mid right coronary artery is the likely culprit and the whole vessel is diffusely diseased from the proximal to the distal segment. 2.  Left ventricular angiography was not performed.  EF was low normal by echo.  Mildly elevated left ventricular end-diastolic pressure. 3.  Successful OCT guided angioplasty and 3 overlapped drug-eluting stents to the right coronary artery.  Difficult procedure due to diffuse disease.    07/22/2024 Echo complete 1. Left ventricular ejection fraction, by estimation, is 50 to 55%. The left ventricle has low normal function. The left ventricle has no regional  wall motion abnormalities. There is moderate left ventricular hypertrophy. Left ventricular diastolic  parameters are consistent with Grade I diastolic dysfunction  (impaired relaxation).   2. Right ventricular systolic function is normal. The right ventricular size is normal.   3. The mitral valve is normal in structure. Trivial mitral valve  regurgitation. No evidence of mitral stenosis.   4. The aortic valve is normal in structure. Aortic valve regurgitation is not visualized. No aortic stenosis is present.   5. The inferior vena cava is normal in size with greater than 50% respiratory variability, suggesting right atrial pressure of 3 mmHg.  EKG:  EKG personally reviewed by me today    PHYSICAL EXAM:    VS:  There were no vitals taken for this visit.  BMI: There is no height or weight on file to calculate BMI.  GEN: Well nourished, well developed in no acute distress NECK: No JVD; No carotid bruits CARDIAC: ***RRR, no murmurs, rubs, gallops RESPIRATORY:  Clear to auscultation without rales, wheezing or rhonchi  ABDOMEN: Soft, non-tender, non-distended EXTREMITIES:  *** No edema; No deformity  Wt Readings from Last 3 Encounters:  10/16/24 188 lb (85.3 kg)  10/08/24 190 lb (86.2 kg)  09/19/24 192 lb 9.6 oz (87.4 kg)           STOP-Bang Score:  4  { Consider Dx Sleep Disordered Breathing or Sleep Apnea  ICD G47.33          :1}       ASSESSMENT & PLAN:   Coronary artery disease   Diastolic heart failure Hypertension   Hyperlipidemia   ? Sleep apnea   Type 2 diabetes     {Are you ordering a CV Procedure (e.g. stress test, cath, DCCV, TEE, etc)?   Press F2        :[id number]}   Disposition: F/u with Dr. Darron or an APP in ***.   Medication Adjustments/Labs and Tests Ordered: Current medicines are reviewed at length with the patient today.  Concerns regarding medicines are outlined above. Medication changes, Labs and Tests ordered today are summarized above and listed in the Patient Instructions accessible in Encounters.   Bonney Lesley Maffucci, PA-C 10/22/2024 10:47 AM     [facility] [address] [place], [place] [address] [phone]      [1]  No outpatient medications have been marked as taking for the 10/25/24 encounter (Appointment) with Maffucci Lesley CROME, PA-C.

## 2024-10-25 ENCOUNTER — Ambulatory Visit: Attending: Physician Assistant | Admitting: Physician Assistant

## 2024-10-25 VITALS — BP 140/76 | HR 67 | Ht 68.0 in | Wt 186.0 lb

## 2024-10-25 DIAGNOSIS — E785 Hyperlipidemia, unspecified: Secondary | ICD-10-CM

## 2024-10-25 DIAGNOSIS — I2510 Atherosclerotic heart disease of native coronary artery without angina pectoris: Secondary | ICD-10-CM | POA: Diagnosis not present

## 2024-10-25 DIAGNOSIS — I5032 Chronic diastolic (congestive) heart failure: Secondary | ICD-10-CM

## 2024-10-25 DIAGNOSIS — R0681 Apnea, not elsewhere classified: Secondary | ICD-10-CM

## 2024-10-25 DIAGNOSIS — I10 Essential (primary) hypertension: Secondary | ICD-10-CM | POA: Diagnosis not present

## 2024-10-25 DIAGNOSIS — E118 Type 2 diabetes mellitus with unspecified complications: Secondary | ICD-10-CM

## 2024-10-25 NOTE — Patient Instructions (Addendum)
 Medication Instructions:  Your physician recommends that you continue on your current medications as directed. Please refer to the Current Medication list given to you today.  *If you need a refill on your cardiac medications before your next appointment, please call your pharmacy*  Lab Work: No labs ordered today  If you have labs (blood work) drawn today and your tests are completely normal, you will receive your results only by: MyChart Message (if you have MyChart) OR A paper copy in the mail If you have any lab test that is abnormal or we need to change your treatment, we will call you to review the results.  Testing/Procedures: No test ordered today   Follow-Up: At [facility], you and your health needs are our priority.  As part of our continuing mission to provide you with exceptional heart care, our providers are all part of one team.  This team includes your primary Cardiologist (physician) and Advanced Practice Providers or APPs (Physician Assistants and Nurse Practitioners) who all work together to provide you with the care you need, when you need it.  Your next appointment:   4 month(s)  Provider:   You may see Deatrice Cage, MD or one of the following Advanced Practice Providers on your designated Care Team:   Lonni Meager, NP Lesley Maffucci, PA-C Bernardino Bring, PA-C Cadence [place], PA-C Tylene Lunch, NP Barnie Hila, NP    We recommend signing up for the patient portal called MyChart.  Sign up information is provided on this After Visit Summary.  MyChart is used to connect with patients for Virtual Visits (Telemedicine).  Patients are able to view lab/test results, encounter notes, upcoming appointments, etc.  Non-urgent messages can be sent to your provider as well.   To learn more about what you can do with MyChart, go to [email or web address].   Other Instructions  Tips to Measure your Blood Pressure Correctly Lesley Maffucci, PA recommends  checking your blood pressure daily and keeping a log.   Here's what you can do to ensure a correct reading:  Don't drink a caffeinated beverage or smoke during the 30 minutes before the test.  Sit quietly for five minutes before the test begins.  During the measurement, sit in a chair with your feet on the floor and your arm supported so your elbow is at about heart level.  The inflatable part of the cuff should completely cover at least 80% of your upper arm, and the cuff should be placed on bare skin, not over a shirt.  Don't talk during the measurement.  Have your blood pressure measured twice, with a brief break in between. If the readings are different by 5 points or more, have it done a third time.   Blood Pressure Log   Date   Time  Blood Pressure  Position  Example: Nov 1 9 AM 124/78 sitting

## 2024-10-28 ENCOUNTER — Other Ambulatory Visit: Payer: Self-pay | Admitting: Physician Assistant

## 2024-11-04 ENCOUNTER — Other Ambulatory Visit: Payer: Self-pay | Admitting: Physician Assistant

## 2024-11-04 DIAGNOSIS — M5416 Radiculopathy, lumbar region: Secondary | ICD-10-CM

## 2024-11-04 DIAGNOSIS — E0841 Diabetes mellitus due to underlying condition with diabetic mononeuropathy: Secondary | ICD-10-CM

## 2024-11-05 ENCOUNTER — Other Ambulatory Visit: Payer: Self-pay | Admitting: Physician Assistant

## 2024-11-05 ENCOUNTER — Ambulatory Visit: Admitting: Physician Assistant

## 2024-11-05 ENCOUNTER — Encounter: Payer: Self-pay | Admitting: Physician Assistant

## 2024-11-05 DIAGNOSIS — M7022 Olecranon bursitis, left elbow: Secondary | ICD-10-CM

## 2024-11-05 MED ORDER — INSULIN GLARGINE 100 UNIT/ML ~~LOC~~ SOLN: 12.0000 [IU] | Freq: Every day | SUBCUTANEOUS | 2 refills | Status: AC

## 2024-11-05 NOTE — Progress Notes (Signed)
 Reports fluid on left elbow noted since yesterday after excess use of arm over the weekend, denies pain.

## 2024-11-05 NOTE — Progress Notes (Signed)
" ° °  Subjective: Left posterior elbow edema and medication refill    Patient ID: Frank Miles, male    DOB: 08-12-1961, 63 y.o.   MRN: [id number]  HPI Patient presents with edema to posterior left elbow.  Patient has a history of olecranon bursitis.  Patient states this complaint started yesterday status post increase physical activity.  Denies loss of function or loss of sensation.   Review of Systems Diabetes, GERD, hyperlipidemia, and hypertension.  History of bone spurs bilateral elbow.    Objective:   Physical Exam No acute distress. Patient has edema to the left posterior elbow..  No erythema.  No drainage.  Patient has full and equal range of motion.  Neurovascular intact.       Assessment & Plan: Olecranon bursitis   Obtain patient consent for aspiration.  Area was surgically cleaned and 1 mL lidocaine  with epinephrine was instilled.  Using 18-gauge needle was able to aspirate 9.5 cc of serous fluid.  Area was recleaned and pressure dressing applied.  Patient given discharge care instructions and will follow-up tomorrow for reevaluation. "

## 2024-11-06 ENCOUNTER — Other Ambulatory Visit: Payer: Self-pay | Admitting: Physician Assistant

## 2024-11-06 ENCOUNTER — Encounter: Payer: Self-pay | Admitting: Physician Assistant

## 2024-11-06 ENCOUNTER — Ambulatory Visit: Admitting: Physician Assistant

## 2024-11-06 VITALS — BP 179/101 | HR 74 | Temp 97.8°F | Resp 12

## 2024-11-06 DIAGNOSIS — M7022 Olecranon bursitis, left elbow: Secondary | ICD-10-CM

## 2024-11-06 MED ORDER — INSULIN GLARGINE 100 UNITS/ML SOLOSTAR PEN: 12.0000 [IU] | PEN_INJECTOR | Freq: Every day | SUBCUTANEOUS | 11 refills | Status: DC

## 2024-11-06 MED ORDER — INSULIN ASPART 100 UNIT/ML FLEXPEN: 2.0000 [IU] | PEN_INJECTOR | Freq: Three times a day (TID) | SUBCUTANEOUS | 11 refills | Status: AC

## 2024-11-06 NOTE — Progress Notes (Signed)
" ° °  Subjective: Olecranon bursitis    Patient ID: Frank Miles, male    DOB: 23-Jul-1961, 63 y.o.   MRN: [id number]  HPI Patient presents for follow-up edema of left olecranon bursitis.  Voices no complaints.  Noticed Small amount of swelling upon a.m. awakening.  Denies pain.  Review of Systems Diabetes, GERD, hyperlipidemia, hypertension, and iron deficiency.    Objective:   Physical Exam BP 179/101  BP Location Left Arm  Patient Position Sitting  Cuff Size Large  Pulse Rate 74  Temp 97.8 F (36.6 C)  Temp Source Temporal  Resp 12  SpO2 97 %  Mild edema posterior left elbow.  Neurovascular intact.  Free and equal range of motion.       Assessment & Plan: Olecranon bursitis   Advised compressive dressing to area and follow-up in 5 days if no improvement or worsening complaint. "

## 2024-12-06 ENCOUNTER — Other Ambulatory Visit (HOSPITAL_COMMUNITY): Payer: Self-pay

## 2025-02-28 ENCOUNTER — Ambulatory Visit: Admitting: Physician Assistant
